# Patient Record
Sex: Female | Born: 1985 | Race: Black or African American | Hispanic: No | Marital: Single | Smoking: Never smoker
Health system: Southern US, Community
[De-identification: ages and names within clinical notes are randomized; demographics above are authoritative.]

## PROBLEM LIST (undated history)

## (undated) ENCOUNTER — Inpatient Hospital Stay (HOSPITAL_COMMUNITY): Payer: Self-pay

## (undated) DIAGNOSIS — O139 Gestational [pregnancy-induced] hypertension without significant proteinuria, unspecified trimester: Secondary | ICD-10-CM

## (undated) DIAGNOSIS — R519 Headache, unspecified: Secondary | ICD-10-CM

## (undated) DIAGNOSIS — I10 Essential (primary) hypertension: Secondary | ICD-10-CM

## (undated) HISTORY — PX: LIPOSUCTION: SHX10

---

## 2007-11-13 ENCOUNTER — Inpatient Hospital Stay (HOSPITAL_COMMUNITY): Admission: AD | Admit: 2007-11-13 | Discharge: 2007-11-14 | Payer: Self-pay | Admitting: Family Medicine

## 2007-11-15 ENCOUNTER — Observation Stay (HOSPITAL_COMMUNITY): Admission: AD | Admit: 2007-11-15 | Discharge: 2007-11-16 | Payer: Self-pay | Admitting: Gynecology

## 2007-11-15 ENCOUNTER — Ambulatory Visit: Payer: Self-pay | Admitting: Obstetrics & Gynecology

## 2007-11-16 ENCOUNTER — Encounter: Payer: Self-pay | Admitting: Obstetrics & Gynecology

## 2008-02-29 ENCOUNTER — Inpatient Hospital Stay (HOSPITAL_COMMUNITY): Admission: AD | Admit: 2008-02-29 | Discharge: 2008-02-29 | Payer: Self-pay | Admitting: Obstetrics & Gynecology

## 2008-04-25 ENCOUNTER — Inpatient Hospital Stay (HOSPITAL_COMMUNITY): Admission: AD | Admit: 2008-04-25 | Discharge: 2008-04-26 | Payer: Self-pay | Admitting: Gynecology

## 2011-01-30 NOTE — Op Note (Signed)
Isabel Cole, HITZEMAN            ACCOUNT NO.:  [id number]   MEDICAL RECORD NO.:  [id number]           PATIENT TYPE:   LOCATION:                                 FACILITY:   PHYSICIAN:  Allie Bossier, MD        DATE OF BIRTH:  05-26-1986   DATE OF PROCEDURE:  DATE OF DISCHARGE:                               OPERATIVE REPORT   PREOPERATIVE DIAGNOSIS:  Incomplete abortion.   POSTOPERATIVE DIAGNOSIS:  Incomplete abortion.   PROCEDURE:  Suction dilatation and curettage.   SURGEON:  Myra C. Marice Potter, MD   ANESTHESIA:  MAC/local (1% lidocaine), Angelica Pou, MD   COMPLICATIONS:  None.   ESTIMATED BLOOD LOSS:  Minimal.   SPECIMEN:  Uterine contents.   DETAILED PROCEDURE AND FINDINGS:  The risks, benefits and alternatives  of surgery were explained, understood and accepted.  Consents were  signed.  She was taken to the operating room and placed in a dorsal  lithotomy position.  MAC anesthesia was applied without complication and  her vagina was prepped and draped the usual sterile fashion.  A bimanual  exam revealed a 6 weeks' size anteverted, mobile uterus and nonenlarged  adnexa.  Her vagina was prepped and draped in the usual sterile fashion,  if I did not mention that.  A speculum was placed.  The anterior lip of  the cervix was grasped with a single-tooth tenaculum.  The cervix was  already dilated approximately 1 cm.  A small amount of membranes were  coming from the os.  The cervix was already dilated to accommodate a #8  curved suction curette.  Suction was applied.  The uterus was emptied of  a moderate amount of contents.  Sharp curettage confirmed complete  emptying of the uterus in all quadrants and the fundus.  A gritty  sensation was appreciated throughout at the end of the procedure.  The  tenaculum was removed.  No bleeding was noted from the site.  Please  note that prior to the uterine manipulation, I did a paracervical block  with 1% lidocaine.  She tolerated the  procedure well.  She was extubated  and taken to the recovery room in stable condition.  She did receive 100  mg of IV doxycycline prior to surgery and will receive Toradol at the  end of the case.      Allie Bossier, MD  Electronically Signed     MCD/MEDQ  D:  11/16/2007  T:  11/17/2007  Job:  [id number]

## 2011-06-08 LAB — CBC
HCT: 37.7
Hemoglobin: 12.8
MCHC: 34.6
MCV: 91.3
Platelets: 300
RBC: 4.16
WBC: 8.7
WBC: 8.9

## 2011-06-08 LAB — HCG, QUANTITATIVE, PREGNANCY: hCG, Beta Chain, Quant, S: 944 — ABNORMAL HIGH

## 2011-06-08 LAB — ABO/RH: ABO/RH(D): B POS

## 2011-06-08 LAB — WET PREP, GENITAL: Yeast Wet Prep HPF POC: NONE SEEN

## 2011-06-14 LAB — CBC
HCT: 36.5
Hemoglobin: 12.7
MCHC: 34.8
MCV: 91.3
RBC: 4

## 2011-06-14 LAB — WET PREP, GENITAL
Trich, Wet Prep: NONE SEEN
Yeast Wet Prep HPF POC: NONE SEEN

## 2011-06-15 LAB — URINALYSIS, ROUTINE W REFLEX MICROSCOPIC
Glucose, UA: NEGATIVE
Specific Gravity, Urine: 1.01
Urobilinogen, UA: 0.2

## 2011-06-15 LAB — URINE MICROSCOPIC-ADD ON

## 2011-06-15 LAB — WET PREP, GENITAL: Yeast Wet Prep HPF POC: NONE SEEN

## 2012-10-17 DIAGNOSIS — G8929 Other chronic pain: Secondary | ICD-10-CM | POA: Insufficient documentation

## 2014-05-17 ENCOUNTER — Emergency Department (HOSPITAL_COMMUNITY)
Admission: EM | Admit: 2014-05-17 | Discharge: 2014-05-18 | Disposition: A | Discharge status: Home / Self Care | Payer: Managed Care, Other (non HMO) | Attending: Emergency Medicine | Admitting: Emergency Medicine

## 2014-05-17 ENCOUNTER — Encounter (HOSPITAL_COMMUNITY): Payer: Self-pay | Admitting: Emergency Medicine

## 2014-05-17 ENCOUNTER — Emergency Department (HOSPITAL_COMMUNITY): Payer: Managed Care, Other (non HMO)

## 2014-05-17 DIAGNOSIS — R112 Nausea with vomiting, unspecified: Secondary | ICD-10-CM | POA: Diagnosis not present

## 2014-05-17 DIAGNOSIS — R197 Diarrhea, unspecified: Secondary | ICD-10-CM | POA: Diagnosis not present

## 2014-05-17 DIAGNOSIS — R109 Unspecified abdominal pain: Secondary | ICD-10-CM | POA: Diagnosis not present

## 2014-05-17 DIAGNOSIS — Z3202 Encounter for pregnancy test, result negative: Secondary | ICD-10-CM | POA: Insufficient documentation

## 2014-05-17 DIAGNOSIS — R509 Fever, unspecified: Secondary | ICD-10-CM | POA: Diagnosis not present

## 2014-05-17 LAB — COMPREHENSIVE METABOLIC PANEL
ALBUMIN: 3.9 g/dL (ref 3.5–5.2)
ALK PHOS: 63 U/L (ref 39–117)
ALT: 21 U/L (ref 0–35)
ANION GAP: 13 (ref 5–15)
AST: 26 U/L (ref 0–37)
BILIRUBIN TOTAL: 0.2 mg/dL — AB (ref 0.3–1.2)
BUN: 9 mg/dL (ref 6–23)
CHLORIDE: 103 meq/L (ref 96–112)
CO2: 24 meq/L (ref 19–32)
Calcium: 9.1 mg/dL (ref 8.4–10.5)
Creatinine, Ser: 0.81 mg/dL (ref 0.50–1.10)
GFR calc Af Amer: >90 mL/min (ref >90)
Glucose, Bld: 94 mg/dL (ref 70–99)
POTASSIUM: 4.1 meq/L (ref 3.7–5.3)
Sodium: 140 mEq/L (ref 137–147)
Total Protein: 7.7 g/dL (ref 6.0–8.3)

## 2014-05-17 LAB — CBC WITH DIFFERENTIAL/PLATELET
Basophils Absolute: 0 10*3/uL (ref 0.0–0.1)
Basophils Relative: 0 % (ref 0–1)
Eosinophils Absolute: 0 10*3/uL (ref 0.0–0.7)
Eosinophils Relative: 0 % (ref 0–5)
HEMATOCRIT: 38.4 % (ref 36.0–46.0)
Hemoglobin: 13.3 g/dL (ref 12.0–15.0)
LYMPHS PCT: 17 % (ref 12–46)
Lymphs Abs: 0.9 10*3/uL (ref 0.7–4.0)
MCH: 30.4 pg (ref 26.0–34.0)
MCHC: 34.6 g/dL (ref 30.0–36.0)
MCV: 87.7 fL (ref 78.0–100.0)
Monocytes Absolute: 0.1 10*3/uL (ref 0.1–1.0)
Monocytes Relative: 3 % (ref 3–12)
NEUTROS PCT: 80 % — AB (ref 43–77)
Neutro Abs: 4.2 10*3/uL (ref 1.7–7.7)
Platelets: 231 10*3/uL (ref 150–400)
RBC: 4.38 MIL/uL (ref 3.87–5.11)
RDW: 11.8 % (ref 11.5–15.5)
WBC: 5.3 10*3/uL (ref 4.0–10.5)

## 2014-05-17 LAB — URINE MICROSCOPIC-ADD ON

## 2014-05-17 LAB — URINALYSIS, ROUTINE W REFLEX MICROSCOPIC
Bilirubin Urine: NEGATIVE
GLUCOSE, UA: NEGATIVE mg/dL
Ketones, ur: NEGATIVE mg/dL
Leukocytes, UA: NEGATIVE
Nitrite: NEGATIVE
PROTEIN: NEGATIVE mg/dL
Specific Gravity, Urine: 1.006 (ref 1.005–1.030)
Urobilinogen, UA: 0.2 mg/dL (ref 0.0–1.0)
pH: 6 (ref 5.0–8.0)

## 2014-05-17 LAB — POC URINE PREG, ED: Preg Test, Ur: NEGATIVE

## 2014-05-17 LAB — LIPASE, BLOOD: Lipase: 44 U/L (ref 11–59)

## 2014-05-17 MED ORDER — SODIUM CHLORIDE 0.9 % IV SOLN
1000.0000 mL | INTRAVENOUS | Status: DC
  Administered 2014-05-17: 1000 mL via INTRAVENOUS

## 2014-05-17 MED ORDER — ACETAMINOPHEN 325 MG PO TABS
650.0000 mg | ORAL_TABLET | Freq: Once | ORAL | Status: AC
  Administered 2014-05-17: 650 mg via ORAL
  Filled 2014-05-17: qty 2

## 2014-05-17 MED ORDER — SODIUM CHLORIDE 0.9 % IV SOLN
1000.0000 mL | Freq: Once | INTRAVENOUS | Status: AC
  Administered 2014-05-17: 1000 mL via INTRAVENOUS

## 2014-05-17 MED ORDER — LOPERAMIDE HCL 2 MG PO CAPS
4.0000 mg | ORAL_CAPSULE | Freq: Once | ORAL | Status: AC
  Administered 2014-05-17: 4 mg via ORAL
  Filled 2014-05-17: qty 2

## 2014-05-17 MED ORDER — MORPHINE SULFATE 4 MG/ML IJ SOLN
4.0000 mg | Freq: Once | INTRAMUSCULAR | Status: AC
  Administered 2014-05-17: 4 mg via INTRAVENOUS
  Filled 2014-05-17: qty 1

## 2014-05-17 MED ORDER — ONDANSETRON HCL 4 MG/2ML IJ SOLN
4.0000 mg | Freq: Once | INTRAMUSCULAR | Status: AC
  Administered 2014-05-17: 4 mg via INTRAVENOUS
  Filled 2014-05-17: qty 2

## 2014-05-17 MED ORDER — IOHEXOL 300 MG/ML  SOLN
50.0000 mL | Freq: Once | INTRAMUSCULAR | Status: AC | PRN
  Administered 2014-05-17: 50 mL via ORAL

## 2014-05-17 NOTE — ED Notes (Signed)
Patient states she has been having abdominal bloating, feeling gassy, and 20 or more diarrheal stools in the past 24 hours. Patient denies any blood in her stool. Patient also has vomited x 4 in the past 24 hours. Patient c/o mid abdominal stabbing afater she drinks or eats anything.

## 2014-05-17 NOTE — ED Provider Notes (Signed)
CSN: [id number]     Arrival date & time 05/17/14  1821 History   First MD Initiated Contact with Patient 05/17/14 2114     Chief Complaint  Patient presents with  . Abdominal Pain  . Diarrhea     (Consider location/radiation/quality/duration/timing/severity/associated sxs/prior Treatment) HPI Patient states yesterday morning she awakened and started having diarrhea. She also started having some right-sided abdominal pain. She states when she eats she feels full and she feels like she's got abdominal bloating. She has vomited twice today and has had about 10 episodes of watery diarrhea without blood. She denies known fever but did have chills today. She states she feels lightheaded without dizziness. She states her eyes are hurting. She states her pain is worse when she changes positions but walking does not make it hurt more. Eating consistently makes it hurt more. Nothing makes it feel better. She has noted some food intolerances for the past year mainly eating greasy foods will make her have nausea. She denies being around anybody else is ill. She denies eating anything that would make her ill. She has never had these symptoms before.  Patient states her mother has had a cholecystectomy, her grandmother on the father's side had diverticulitis.  PCP Dr Iron at [facility]  History reviewed. No pertinent past medical history. Past Surgical History  Procedure Laterality Date  . Cesarean section     Family History  Problem Relation Age of Onset  . Cancer Mother    History  Substance Use Topics  . Smoking status: Never Smoker   . Smokeless tobacco: Never Used  . Alcohol Use: No   Employed as an LPN  OB History   Grav Para Term Preterm Abortions TAB SAB Ect Mult Living                 Review of Systems  All other systems reviewed and are negative.     Allergies  Review of patient's allergies indicates no known allergies.  Home Medications   Prior to  Admission medications   Medication Sig Start Date End Date Taking? Authorizing Provider  ibuprofen (ADVIL,MOTRIN) 800 MG tablet Take 800 mg by mouth once as needed for moderate pain.   Yes Historical Provider, MD   BP 139/80  Pulse 92  Temp(Src) 102.8 F (39.3 C) (Oral)  Resp 16  Ht  (1.549 m)  Wt 145 lb (65.772 kg)  BMI 27.41 kg/m2  SpO2 100%  LMP 04/20/2014  Vital signs normal except for fever  Physical Exam  Nursing note and vitals reviewed. Constitutional: She is oriented to person, place, and time. She appears well-developed and well-nourished.  Non-toxic appearance. She does not appear ill. No distress.  HENT:  Head: Normocephalic and atraumatic.  Right Ear: External ear normal.  Left Ear: External ear normal.  Nose: Nose normal. No mucosal edema or rhinorrhea.  Mouth/Throat: Mucous membranes are dry. No dental abscesses or uvula swelling.  Eyes: Conjunctivae and EOM are normal. Pupils are equal, round, and reactive to light.  Neck: Normal range of motion and full passive range of motion without pain. Neck supple.  Cardiovascular: Normal rate, regular rhythm and normal heart sounds.  Exam reveals no gallop and no friction rub.   No murmur heard. Pulmonary/Chest: Effort normal and breath sounds normal. No respiratory distress. She has no wheezes. She has no rhonchi. She has no rales. She exhibits no tenderness and no crepitus.  Abdominal: Soft. Normal appearance and bowel sounds are normal. She  exhibits no distension. There is tenderness. There is no rebound and no guarding.    Tender in the right mid and upper abdomen, appears uncomfortable when change her stretcher positions  Musculoskeletal: Normal range of motion. She exhibits no edema and no tenderness.  Moves all extremities well.   Neurological: She is alert and oriented to person, place, and time. She has normal strength. No cranial nerve deficit.  Skin: Skin is warm, dry and intact. No rash noted. No erythema.  No pallor.  Psychiatric: She has a normal mood and affect. Her speech is normal and behavior is normal. Her mood appears not anxious.    ED Course  Procedures (including critical care time)  Medications  0.9 %  sodium chloride infusion (0 mLs Intravenous Stopped 05/17/14 2358)    Followed by  0.9 %  sodium chloride infusion (0 mLs Intravenous Stopped 05/17/14 2359)    Followed by  0.9 %  sodium chloride infusion (1,000 mLs Intravenous New Bag/Given 05/17/14 2359)  acetaminophen (TYLENOL) tablet 650 mg (650 mg Oral Given 05/17/14 2148)  morphine 4 MG/ML injection 4 mg (4 mg Intravenous Given 05/17/14 2219)  ondansetron (ZOFRAN) injection 4 mg (4 mg Intravenous Given 05/17/14 2219)  loperamide (IMODIUM) capsule 4 mg (4 mg Oral Given 05/17/14 2219)  iohexol (OMNIPAQUE) 300 MG/ML solution 50 mL (50 mLs Oral Contrast Given 05/17/14 2300)  iohexol (OMNIPAQUE) 300 MG/ML solution 100 mL (100 mLs Intravenous Contrast Given 05/18/14 0103)    Pt given IV fluids for her dehydation. [place] does not show cholecystitis. 01:17 Pt in CT scan of her AP. Dr Criss Alvine will check her CT results.    Labs Review Results for orders placed during the hospital encounter of 05/17/14  CBC WITH DIFFERENTIAL      Result Value Ref Range   WBC 5.3  4.0 - 10.5 K/uL   RBC 4.38  3.87 - 5.11 MIL/uL   Hemoglobin 13.3  12.0 - 15.0 g/dL   HCT 16.1  09.6 - 04.5 %   MCV 87.7  78.0 - 100.0 fL   MCH 30.4  26.0 - 34.0 pg   MCHC 34.6  30.0 - 36.0 g/dL   RDW 40.9  81.1 - 91.4 %   Platelets 231  150 - 400 K/uL   Neutrophils Relative % 80 (*) 43 - 77 %   Neutro Abs 4.2  1.7 - 7.7 K/uL   Lymphocytes Relative 17  12 - 46 %   Lymphs Abs 0.9  0.7 - 4.0 K/uL   Monocytes Relative 3  3 - 12 %   Monocytes Absolute 0.1  0.1 - 1.0 K/uL   Eosinophils Relative 0  0 - 5 %   Eosinophils Absolute 0.0  0.0 - 0.7 K/uL   Basophils Relative 0  0 - 1 %   Basophils Absolute 0.0  0.0 - 0.1 K/uL  COMPREHENSIVE METABOLIC PANEL      Result Value Ref Range    Sodium 140  137 - 147 mEq/L   Potassium 4.1  3.7 - 5.3 mEq/L   Chloride 103  96 - 112 mEq/L   CO2 24  19 - 32 mEq/L   Glucose, Bld 94  70 - 99 mg/dL   BUN 9  6 - 23 mg/dL   Creatinine, Ser 7.82  0.50 - 1.10 mg/dL   Calcium 9.1  8.4 - 95.6 mg/dL   Total Protein 7.7  6.0 - 8.3 g/dL   Albumin 3.9  3.5 - 5.2 g/dL   AST  26  0 - 37 U/L   ALT 21  0 - 35 U/L   Alkaline Phosphatase 63  39 - 117 U/L   Total Bilirubin 0.2 (*) 0.3 - 1.2 mg/dL   GFR calc non Af Amer >90  >90 mL/min   GFR calc Af Amer >90  >90 mL/min   Anion gap 13  5 - 15  LIPASE, BLOOD      Result Value Ref Range   Lipase 44  11 - 59 U/L  URINALYSIS, ROUTINE W REFLEX MICROSCOPIC      Result Value Ref Range   Color, Urine YELLOW  YELLOW   APPearance CLEAR  CLEAR   Specific Gravity, Urine 1.006  1.005 - 1.030   pH 6.0  5.0 - 8.0   Glucose, UA NEGATIVE  NEGATIVE mg/dL   Hgb urine dipstick MODERATE (*) NEGATIVE   Bilirubin Urine NEGATIVE  NEGATIVE   Ketones, ur NEGATIVE  NEGATIVE mg/dL   Protein, ur NEGATIVE  NEGATIVE mg/dL   Urobilinogen, UA 0.2  0.0 - 1.0 mg/dL   Nitrite NEGATIVE  NEGATIVE   Leukocytes, UA NEGATIVE  NEGATIVE  URINE MICROSCOPIC-ADD ON      Result Value Ref Range   Squamous Epithelial / LPF RARE  RARE   WBC, UA 3-6  <3 WBC/hpf   RBC / HPF 7-10  <3 RBC/hpf   Bacteria, UA RARE  RARE  POC URINE PREG, ED      Result Value Ref Range   Preg Test, Ur NEGATIVE  NEGATIVE    Laboratory interpretation all normal except hematuria    Imaging Review US Abdomen Limited Ruq  05/17/2014   CLINICAL DATA:  Right upper quadrant pain, fever, nausea, vomiting.  EXAM: US ABDOMEN LIMITED - RIGHT UPPER QUADRANT  COMPARISON:  None.  FINDINGS: Gallbladder:  Contracted, with mild wall thickening at 4 mm. No shadowing gallstones. Negative sonographic Murphy sign.  Common bile duct:  Diameter: Within normal limits at 4 mm  Liver:  No focal lesion identified. Within normal limits in parenchymal echogenicity.  IMPRESSION:  Contracted gallbladder results in mild wall thickening. No gallstones or sonographic evidence for cholecystitis.   Electronically Signed   By: Jearld Lesch M.D.   On: 05/17/2014 23:31     EKG Interpretation None      MDM   Final diagnoses:  Nausea vomiting and diarrhea  Abdominal pain, right lateral     Disposition pending   Devoria Albe, MD, Armando Gang    Ward Givens, MD 05/18/14 [phone]

## 2014-05-18 ENCOUNTER — Encounter (HOSPITAL_COMMUNITY): Payer: Self-pay | Admitting: Radiology

## 2014-05-18 MED ORDER — IOHEXOL 300 MG/ML  SOLN
100.0000 mL | Freq: Once | INTRAMUSCULAR | Status: AC | PRN
Start: 2014-05-18 — End: 2014-05-18
  Administered 2014-05-18: 100 mL via INTRAVENOUS

## 2014-05-18 NOTE — Discharge Instructions (Signed)
Abdominal Pain Many things can cause abdominal pain. Usually, abdominal pain is not caused by a disease and will improve without treatment. It can often be observed and treated at home. Your health care provider will do a physical exam and possibly order blood tests and X-rays to help determine the seriousness of your pain. However, in many cases, more time must pass before a clear cause of the pain can be found. Before that point, your health care provider may not know if you need more testing or further treatment. HOME CARE INSTRUCTIONS  Monitor your abdominal pain for any changes. The following actions may help to alleviate any discomfort you are experiencing:  Only take over-the-counter or prescription medicines as directed by your health care provider.  Do not take laxatives unless directed to do so by your health care provider.  Try a clear liquid diet (broth, tea, or water) as directed by your health care provider. Slowly move to a bland diet as tolerated. SEEK MEDICAL CARE IF:  You have unexplained abdominal pain.  You have abdominal pain associated with nausea or diarrhea.  You have pain when you urinate or have a bowel movement.  You experience abdominal pain that wakes you in the night.  You have abdominal pain that is worsened or improved by eating food.  You have abdominal pain that is worsened with eating fatty foods.  You have a fever. SEEK IMMEDIATE MEDICAL CARE IF:   Your pain does not go away within 2 hours.  You keep throwing up (vomiting).  Your pain is felt only in portions of the abdomen, such as the right side or the left lower portion of the abdomen.  You pass bloody or black tarry stools. MAKE SURE YOU:  Understand these instructions.   Will watch your condition.   Will get help right away if you are not doing well or get worse.  Document Released: 06/13/2005 Document Revised: 09/08/2013 Document Reviewed: 05/13/2013 [facility] Patient Information  2015 [place], [place]. This information is not intended to replace advice given to you by your health care provider. Make sure you discuss any questions you have with your health care provider.   Viral Gastroenteritis Viral gastroenteritis is also known as stomach flu. This condition affects the stomach and intestinal tract. It can cause sudden diarrhea and vomiting. The illness typically lasts 3 to 8 days. Most people develop an immune response that eventually gets rid of the virus. While this natural response develops, the virus can make you quite ill. CAUSES  Many different viruses can cause gastroenteritis, such as rotavirus or noroviruses. You can catch one of these viruses by consuming contaminated food or water. You may also catch a virus by sharing utensils or other personal items with an infected person or by touching a contaminated surface. SYMPTOMS  The most common symptoms are diarrhea and vomiting. These problems can cause a severe loss of body fluids (dehydration) and a body salt (electrolyte) imbalance. Other symptoms may include:  Fever.  Headache.  Fatigue.  Abdominal pain. DIAGNOSIS  Your caregiver can usually diagnose viral gastroenteritis based on your symptoms and a physical exam. A stool sample may also be taken to test for the presence of viruses or other infections. TREATMENT  This illness typically goes away on its own. Treatments are aimed at rehydration. The most serious cases of viral gastroenteritis involve vomiting so severely that you are not able to keep fluids down. In these cases, fluids must be given through an intravenous line (IV).  HOME CARE INSTRUCTIONS   Drink enough fluids to keep your urine clear or pale yellow. Drink small amounts of fluids frequently and increase the amounts as tolerated.  Ask your caregiver for specific rehydration instructions.  Avoid:  Foods high in sugar.  Alcohol.  Carbonated drinks.  Tobacco.  Juice.  Caffeine  drinks.  Extremely hot or cold fluids.  Fatty, greasy foods.  Too much intake of anything at one time.  Dairy products until 24 to 48 hours after diarrhea stops.  You may consume probiotics. Probiotics are active cultures of beneficial bacteria. They may lessen the amount and number of diarrheal stools in adults. Probiotics can be found in yogurt with active cultures and in supplements.  Wash your hands well to avoid spreading the virus.  Only take over-the-counter or prescription medicines for pain, discomfort, or fever as directed by your caregiver. Do not give aspirin to children. Antidiarrheal medicines are not recommended.  Ask your caregiver if you should continue to take your regular prescribed and over-the-counter medicines.  Keep all follow-up appointments as directed by your caregiver. SEEK IMMEDIATE MEDICAL CARE IF:   You are unable to keep fluids down.  You do not urinate at least once every 6 to 8 hours.  You develop shortness of breath.  You notice blood in your stool or vomit. This may look like coffee grounds.  You have abdominal pain that increases or is concentrated in one small area (localized).  You have persistent vomiting or diarrhea.  You have a fever.  The patient is a child younger than 3 months, and he or she has a fever.  The patient is a child older than 3 months, and he or she has a fever and persistent symptoms.  The patient is a child older than 3 months, and he or she has a fever and symptoms suddenly get worse.  The patient is a baby, and he or she has no tears when crying. MAKE SURE YOU:   Understand these instructions.  Will watch your condition.  Will get help right away if you are not doing well or get worse. Document Released: 09/03/2005 Document Revised: 11/26/2011 Document Reviewed: 06/20/2011 [facility] Patient Information 2015 [place], [place]. This information is not intended to replace advice given to you by your health care  provider. Make sure you discuss any questions you have with your health care provider.   Food Choices to Help Relieve Diarrhea When you have diarrhea, the foods you eat and your eating habits are very important. Choosing the right foods and drinks can help relieve diarrhea. Also, because diarrhea can last up to 7 days, you need to replace lost fluids and electrolytes (such as sodium, potassium, and chloride) in order to help prevent dehydration.  WHAT GENERAL GUIDELINES DO I NEED TO FOLLOW?  Slowly drink 1 cup (8 oz) of fluid for each episode of diarrhea. If you are getting enough fluid, your urine will be clear or pale yellow.  Eat starchy foods. Some good choices include white rice, white toast, pasta, low-fiber cereal, baked potatoes (without the skin), saltine crackers, and bagels.  Avoid large servings of any cooked vegetables.  Limit fruit to two servings per day. A serving is  cup or 1 small piece.  Choose foods with less than 2 g of fiber per serving.  Limit fats to less than 8 tsp (38 g) per day.  Avoid fried foods.  Eat foods that have probiotics in them. Probiotics can be found in certain dairy products.  Avoid foods and beverages that may increase the speed at which food moves through the stomach and intestines (gastrointestinal tract). Things to avoid include:  High-fiber foods, such as dried fruit, raw fruits and vegetables, nuts, seeds, and whole grain foods.  Spicy foods and high-fat foods.  Foods and beverages sweetened with high-fructose corn syrup, honey, or sugar alcohols such as xylitol, sorbitol, and mannitol. WHAT FOODS ARE RECOMMENDED? Grains White rice. White, [place], or pita breads (fresh or toasted), including plain rolls, buns, or bagels. White pasta. Saltine, soda, or graham crackers. Pretzels. Low-fiber cereal. Cooked cereals made with water (such as cornmeal, farina, or cream cereals). Plain muffins. Matzo. Melba toast. Zwieback.  Vegetables Potatoes  (without the skin). Strained tomato and vegetable juices. Most well-cooked and canned vegetables without seeds. Tender lettuce. Fruits Cooked or canned applesauce, apricots, cherries, fruit cocktail, grapefruit, peaches, pears, or plums. Fresh bananas, apples without skin, cherries, grapes, cantaloupe, grapefruit, peaches, oranges, or plums.  Meat and Other Protein Products Baked or boiled chicken. Eggs. Tofu. Fish. Seafood. Smooth peanut butter. Ground or well-cooked tender beef, ham, veal, lamb, pork, or poultry.  Dairy Plain yogurt, kefir, and unsweetened liquid yogurt. Lactose-free milk, buttermilk, or soy milk. Plain hard cheese. Beverages Sport drinks. Clear broths. Diluted fruit juices (except prune). Regular, caffeine-free sodas such as ginger ale. Water. Decaffeinated teas. Oral rehydration solutions. Sugar-free beverages not sweetened with sugar alcohols. Other Bouillon, broth, or soups made from recommended foods.  The items listed above may not be a complete list of recommended foods or beverages. Contact your dietitian for more options. WHAT FOODS ARE NOT RECOMMENDED? Grains Whole grain, whole wheat, bran, or rye breads, rolls, pastas, crackers, and cereals. Wild or brown rice. Cereals that contain more than 2 g of fiber per serving. Corn tortillas or taco shells. Cooked or dry oatmeal. Granola. Popcorn. Vegetables Raw vegetables. Cabbage, broccoli, Brussels sprouts, artichokes, baked beans, beet greens, corn, kale, legumes, peas, sweet potatoes, and yams. Potato skins. Cooked spinach and cabbage. Fruits Dried fruit, including raisins and dates. Raw fruits. Stewed or dried prunes. Fresh apples with skin, apricots, mangoes, pears, raspberries, and strawberries.  Meat and Other Protein Products Chunky peanut butter. Nuts and seeds. Beans and lentils. Tomasa Blase.  Dairy High-fat cheeses. Milk, chocolate milk, and beverages made with milk, such as milk shakes. Cream. Ice cream. Sweets and  Desserts Sweet rolls, doughnuts, and sweet breads. Pancakes and waffles. Fats and Oils Butter. Cream sauces. Margarine. Salad oils. Plain salad dressings. Olives. Avocados.  Beverages Caffeinated beverages (such as coffee, tea, soda, or energy drinks). Alcoholic beverages. Fruit juices with pulp. Prune juice. Soft drinks sweetened with high-fructose corn syrup or sugar alcohols. Other Coconut. Hot sauce. Chili powder. Mayonnaise. Gravy. Cream-based or milk-based soups.  The items listed above may not be a complete list of foods and beverages to avoid. Contact your dietitian for more information. WHAT SHOULD I DO IF I BECOME DEHYDRATED? Diarrhea can sometimes lead to dehydration. Signs of dehydration include dark urine and dry mouth and skin. If you think you are dehydrated, you should rehydrate with an oral rehydration solution. These solutions can be purchased at pharmacies, retail stores, or online.  Drink -1 cup (120-240 mL) of oral rehydration solution each time you have an episode of diarrhea. If drinking this amount makes your diarrhea worse, try drinking smaller amounts more often. For example, drink 1-3 tsp (5-15 mL) every 5-10 minutes.  A general rule for staying hydrated is to drink 1-2 L of fluid per day. Talk  to your health care provider about the specific amount you should be drinking each day. Drink enough fluids to keep your urine clear or pale yellow. Document Released: 11/24/2003 Document Revised: 09/08/2013 Document Reviewed: 07/27/2013 [facility] Patient Information 2015 [place], [place]. This information is not intended to replace advice given to you by your health care provider. Make sure you discuss any questions you have with your health care provider.

## 2014-05-18 NOTE — ED Notes (Signed)
Patient transported to CT 

## 2014-05-18 NOTE — ED Provider Notes (Signed)
2:07 AM Care transferred to me. Patient's CT scan shows mild enteral enteritis. Patient is well-appearing currently and feels significantly improved after fluids. She denies significant nausea and declines any Zofran. She is currently ready for discharge. Discussed increase fluid intake, diet for diarrhea, and return precautions.  Audree Camel, MD 05/18/14 [phone]

## 2014-07-27 ENCOUNTER — Encounter (HOSPITAL_COMMUNITY): Payer: Self-pay | Admitting: *Deleted

## 2014-07-27 ENCOUNTER — Inpatient Hospital Stay (HOSPITAL_COMMUNITY)
Admission: AD | Admit: 2014-07-27 | Discharge: 2014-07-27 | Disposition: A | Discharge status: Home / Self Care | Payer: Managed Care, Other (non HMO) | Source: Ambulatory Visit | Attending: Obstetrics & Gynecology | Admitting: Obstetrics & Gynecology

## 2014-07-27 ENCOUNTER — Inpatient Hospital Stay (HOSPITAL_COMMUNITY): Payer: Managed Care, Other (non HMO)

## 2014-07-27 DIAGNOSIS — O3481 Maternal care for other abnormalities of pelvic organs, first trimester: Secondary | ICD-10-CM | POA: Diagnosis not present

## 2014-07-27 DIAGNOSIS — O10011 Pre-existing essential hypertension complicating pregnancy, first trimester: Secondary | ICD-10-CM | POA: Insufficient documentation

## 2014-07-27 DIAGNOSIS — R109 Unspecified abdominal pain: Secondary | ICD-10-CM | POA: Diagnosis present

## 2014-07-27 DIAGNOSIS — Z331 Pregnant state, incidental: Secondary | ICD-10-CM

## 2014-07-27 DIAGNOSIS — R1031 Right lower quadrant pain: Secondary | ICD-10-CM | POA: Insufficient documentation

## 2014-07-27 DIAGNOSIS — Z3490 Encounter for supervision of normal pregnancy, unspecified, unspecified trimester: Secondary | ICD-10-CM

## 2014-07-27 DIAGNOSIS — N831 Corpus luteum cyst: Secondary | ICD-10-CM

## 2014-07-27 DIAGNOSIS — N8320 Unspecified ovarian cysts: Secondary | ICD-10-CM | POA: Insufficient documentation

## 2014-07-27 DIAGNOSIS — Z3A01 Less than 8 weeks gestation of pregnancy: Secondary | ICD-10-CM | POA: Diagnosis not present

## 2014-07-27 DIAGNOSIS — O9989 Other specified diseases and conditions complicating pregnancy, childbirth and the puerperium: Secondary | ICD-10-CM | POA: Insufficient documentation

## 2014-07-27 DIAGNOSIS — I10 Essential (primary) hypertension: Secondary | ICD-10-CM

## 2014-07-27 LAB — CBC
HCT: 35.9 % — ABNORMAL LOW (ref 36.0–46.0)
Hemoglobin: 12.4 g/dL (ref 12.0–15.0)
MCH: 30.6 pg (ref 26.0–34.0)
MCHC: 34.5 g/dL (ref 30.0–36.0)
MCV: 88.6 fL (ref 78.0–100.0)
PLATELETS: 245 10*3/uL (ref 150–400)
RBC: 4.05 MIL/uL (ref 3.87–5.11)
RDW: 12.5 % (ref 11.5–15.5)
WBC: 7.1 10*3/uL (ref 4.0–10.5)

## 2014-07-27 LAB — URINALYSIS, ROUTINE W REFLEX MICROSCOPIC
Bilirubin Urine: NEGATIVE
Glucose, UA: NEGATIVE mg/dL
Hgb urine dipstick: NEGATIVE
KETONES UR: NEGATIVE mg/dL
LEUKOCYTES UA: NEGATIVE
NITRITE: NEGATIVE
PH: 7 (ref 5.0–8.0)
PROTEIN: NEGATIVE mg/dL
Specific Gravity, Urine: 1.015 (ref 1.005–1.030)
Urobilinogen, UA: 0.2 mg/dL (ref 0.0–1.0)

## 2014-07-27 LAB — WET PREP, GENITAL
Clue Cells Wet Prep HPF POC: NONE SEEN
Trich, Wet Prep: NONE SEEN
WBC, Wet Prep HPF POC: NONE SEEN
Yeast Wet Prep HPF POC: NONE SEEN

## 2014-07-27 LAB — POCT PREGNANCY, URINE: Preg Test, Ur: POSITIVE — AB

## 2014-07-27 LAB — HIV ANTIBODY (ROUTINE TESTING W REFLEX): HIV 1&2 Ab, 4th Generation: NONREACTIVE

## 2014-07-27 LAB — HCG, QUANTITATIVE, PREGNANCY: hCG, Beta Chain, Quant, S: 19307 m[IU]/mL — ABNORMAL HIGH (ref <5)

## 2014-07-27 MED ORDER — LABETALOL HCL 100 MG PO TABS: 100.0000 mg | ORAL_TABLET | Freq: Two times a day (BID) | ORAL | Status: DC

## 2014-07-27 NOTE — MAU Note (Signed)
Pt states that she has a history of high blood pressure, but she is on Norvasc and knows that it can't be taken during pregnancy so she hasn't been taking meds. Delivered last baby at 28 weeks for pre-eclampsia

## 2014-07-27 NOTE — MAU Note (Signed)
Patient state she has had 2 positive home pregnancy tests. Started having right lower abdominal pain 2 days ago. Denies bleeding or discharge, nausea or vomiting.

## 2014-07-27 NOTE — MAU Provider Note (Signed)
History     CSN: [id number]  Arrival date and time: 07/27/14 4098   First Provider Initiated Contact with Patient 07/27/14 [phone]      Chief Complaint  Patient presents with  . Possible Pregnancy  . Abdominal Pain   HPI   Pt is a 28 y/o G2P1 with EGA of [redacted]w[redacted]d based on LMP who presents today with right sided abdominal pain and possible pregnancy. She reports the pain started on Sunday afternoon and has gotten progressively worse. She has not taken anything for the pain. The pain is worse when moving and better when laying down. She reports the pain is shooting and comes and goes. She has not had pain like this before. She denies any nausea, vomiting, diarrhea, fever, chills, headaches, dysuria, hematuria, vaginal bleeding, or abnormal discharge. She reports some constipation and bleeding with hemorrhoids. Her LMP was 10/2. She reports previous pregnancy complications of preeclampsia and delivery of her son via c-section at 28 weeks. She has had no other abdominal surgeries. She reports one sexual partner in the past year. She has an appt. For [facility] at [facility] on 11/19. She also stopped taking her Norvasc 4 days ago when she had a positive at home pregnancy test. She reports BP's of 130s/80s for the past 4 days. She reports taking procardia while breastfeeding with her last pregnancy.   OB History    Gravida Para Term Preterm AB TAB SAB Ectopic Multiple Living   2 1 0 1 1  1   1       No past medical history on file.  Past Surgical History  Procedure Laterality Date  . Cesarean section      Family History  Problem Relation Age of Onset  . Cancer Mother     History  Substance Use Topics  . Smoking status: Never Smoker   . Smokeless tobacco: Never Used  . Alcohol Use: No    Allergies: No Known Allergies  Prescriptions prior to admission  Medication Sig Dispense Refill Last Dose  . amLODipine (NORVASC) 10 MG tablet Take 10 mg by mouth daily.   Past Week at Unknown time   . Prenatal Vit-Fe Fumarate-FA (PRENATAL MULTIVITAMIN) TABS tablet Take 1 tablet by mouth daily at 12 noon.   07/26/2014 at Unknown time  . ibuprofen (ADVIL,MOTRIN) 800 MG tablet Take 800 mg by mouth once as needed for moderate pain.   05/17/2014 at Unknown time    Review of Systems  Constitutional: Negative for fever and chills.  Respiratory: Negative for shortness of breath.   Cardiovascular: Negative for chest pain.  Gastrointestinal: Positive for abdominal pain and constipation. Negative for nausea, vomiting, diarrhea, blood in stool and melena.  Genitourinary: Negative for dysuria and hematuria.       No vaginal bleeding, cramping, or abnormal discharge.   Neurological: Negative for dizziness and headaches.   Physical Exam   Blood pressure 148/95, pulse 103, temperature 98.3 F (36.8 C), resp. rate 18, height 5\' 3"  (1.6 m), weight 69.582 kg (153 lb 6.4 oz).  Physical Exam  Constitutional: She is oriented to person, place, and time. She appears well-developed and well-nourished.  Cardiovascular: Normal rate, regular rhythm, normal heart sounds and intact distal pulses.   HR: 84 bpm on repeat exam  Respiratory: Effort normal and breath sounds normal. No respiratory distress. She has no wheezes. She has no rales.  GI: Soft. Bowel sounds are normal. She exhibits no distension. There is tenderness. There is no rebound, no guarding, no CVA  tenderness, no tenderness at McBurney's point and negative Murphy's sign.  Mild RLQ tenderness to palpation   Genitourinary: Uterus normal. There is no tenderness or lesion on the right labia. There is no tenderness or lesion on the left labia. Uterus is not tender. Cervix exhibits no motion tenderness, no discharge and no friability. Right adnexum displays no mass, no tenderness and no fullness. Left adnexum displays no mass, no tenderness and no fullness. No erythema, tenderness or bleeding in the vagina. No foreign body around the vagina. No signs of  injury around the vagina. Vaginal discharge found.  Creamy, white vaginal discharge without odor  Musculoskeletal: She exhibits no edema or tenderness.  Neurological: She is alert and oriented to person, place, and time.    MAU Course  Procedures  +UPT  U/A: WNL  CBC: WNL  GC/C: pending  Wet prep: None seen  U/S: single IUP measuring [redacted]w[redacted]d by crown-rump length - right ovary with notable corpus luteum cyst    US Ob Comp Less 14 Wks  07/27/2014   CLINICAL DATA:  Right lower quadrant pain x2 days  EXAM: OBSTETRIC <14 WK [place] AND TRANSVAGINAL OB US  TECHNIQUE: Both transabdominal and transvaginal ultrasound examinations were performed for complete evaluation of the gestation as well as the maternal uterus, adnexal regions, and pelvic cul-de-sac. Transvaginal technique was performed to assess early pregnancy.  COMPARISON:  None.  FINDINGS: Intrauterine gestational sac: Visualized/normal in shape.  Yolk sac:  Present  Embryo:  Present  Cardiac Activity: Not discretely visualized.  CRL:   3.6  mm   6 w 1 d                  [place] EDC: 03/21/2015  Maternal uterus/adnexae: No subchorionic hemorrhage.  Right ovary measures 2.9 x 2.2 x 2.6 cm and is notable for a corpus luteal cyst.  Left ovary measures 3.1 x 1.7 x 1.7 cm and is within normal limits.  Trace pelvic fluid.  IMPRESSION: Single intrauterine gestation, measuring 6 weeks 1 day by crown-rump length.  No cardiac activity is visualized, although possibly technical (due to the small size of the fetal pole).  Serial beta HCG is suggested, supplemented by short-term follow-up ultrasound in 3-5 days to document viability as clinically warranted.   Electronically Signed   By: Charline Bills M.D.   On: 07/27/2014 10:41   US Ob Transvaginal  07/27/2014   CLINICAL DATA:  Right lower quadrant pain x2 days  EXAM: OBSTETRIC <14 WK [place] AND TRANSVAGINAL OB US  TECHNIQUE: Both transabdominal and transvaginal ultrasound examinations were performed for complete  evaluation of the gestation as well as the maternal uterus, adnexal regions, and pelvic cul-de-sac. Transvaginal technique was performed to assess early pregnancy.  COMPARISON:  None.  FINDINGS: Intrauterine gestational sac: Visualized/normal in shape.  Yolk sac:  Present  Embryo:  Present  Cardiac Activity: Not discretely visualized.  CRL:   3.6  mm   6 w 1 d                  [place] EDC: 03/21/2015  Maternal uterus/adnexae: No subchorionic hemorrhage.  Right ovary measures 2.9 x 2.2 x 2.6 cm and is notable for a corpus luteal cyst.  Left ovary measures 3.1 x 1.7 x 1.7 cm and is within normal limits.  Trace pelvic fluid.  IMPRESSION: Single intrauterine gestation, measuring 6 weeks 1 day by crown-rump length.  No cardiac activity is visualized, although possibly technical (due to the small size of the  fetal pole).  Serial beta HCG is suggested, supplemented by short-term follow-up ultrasound in 3-5 days to document viability as clinically warranted.   Electronically Signed   By: Charline BillsSriyesh  Krishnan M.D.   On: 07/27/2014 10:41    Results for orders placed or performed during the hospital encounter of 07/27/14 (from the past 72 hour(s))  Urinalysis, Routine w reflex microscopic     Status: None   Collection Time: 07/27/14  8:55 AM  Result Value Ref Range   Color, Urine YELLOW YELLOW   APPearance CLEAR CLEAR   Specific Gravity, Urine 1.015 1.005 - 1.030   pH 7.0 5.0 - 8.0   Glucose, UA NEGATIVE NEGATIVE mg/dL   Hgb urine dipstick NEGATIVE NEGATIVE   Bilirubin Urine NEGATIVE NEGATIVE   Ketones, ur NEGATIVE NEGATIVE mg/dL   Protein, ur NEGATIVE NEGATIVE mg/dL   Urobilinogen, UA 0.2 0.0 - 1.0 mg/dL   Nitrite NEGATIVE NEGATIVE   Leukocytes, UA NEGATIVE NEGATIVE    Comment: MICROSCOPIC NOT DONE ON URINES WITH NEGATIVE PROTEIN, BLOOD, LEUKOCYTES, NITRITE, OR GLUCOSE <1000 mg/dL.  Pregnancy, urine POC     Status: Abnormal   Collection Time: 07/27/14  9:01 AM  Result Value Ref Range   Preg Test, Ur POSITIVE  (A) NEGATIVE    Comment:        THE SENSITIVITY OF THIS METHODOLOGY IS >24 mIU/mL   hCG, quantitative, pregnancy     Status: Abnormal   Collection Time: 07/27/14  9:22 AM  Result Value Ref Range   hCG, Beta Chain, Quant, S 19307 (H) <5 mIU/mL    Comment:          GEST. AGE      CONC.  (mIU/mL)   <=1 WEEK        5 - 50     2 WEEKS       50 - 500     3 WEEKS       100 - 10,000     4 WEEKS     1,000 - 30,000     5 WEEKS     3,500 - 115,000   6-8 WEEKS     12,000 - 270,000    12 WEEKS     15,000 - 220,000        FEMALE AND NON-PREGNANT FEMALE:     LESS THAN 5 mIU/mL   CBC     Status: Abnormal   Collection Time: 07/27/14  9:25 AM  Result Value Ref Range   WBC 7.1 4.0 - 10.5 K/uL   RBC 4.05 3.87 - 5.11 MIL/uL   Hemoglobin 12.4 12.0 - 15.0 g/dL   HCT 04.535.9 (L) 40.936.0 - 81.146.0 %   MCV 88.6 78.0 - 100.0 fL   MCH 30.6 26.0 - 34.0 pg   MCHC 34.5 30.0 - 36.0 g/dL   RDW 91.412.5 78.211.5 - 95.615.5 %   Platelets 245 150 - 400 K/uL  Wet prep, genital     Status: None   Collection Time: 07/27/14 10:25 AM  Result Value Ref Range   Yeast Wet Prep HPF POC NONE SEEN NONE SEEN   Trich, Wet Prep NONE SEEN NONE SEEN   Clue Cells Wet Prep HPF POC NONE SEEN NONE SEEN   WBC, Wet Prep HPF POC NONE SEEN NONE SEEN    Comment: NO BACTERIA SEEN    Low suspicion for appendicitis due to lack of GI symptoms and no elevated WBC.   Assessment and Plan  A: 28 y/o G3P1 with incidental  finding of pregnancy and RLQ pain secondary to corpus luteum cyst. Chronic HTN with no current medication.    P: D/C home  - Rx Labetalol 100 mg BID  - counseled patient on the physiology of corpus luteum cyst  - Warm compress and APAP for pain  - [facility]   Howard, Tayla 07/27/2014, 9:43 AM   I was present for the exam and agree with above.  [place] Favor, CNM 07/28/2014 10:40 AM

## 2014-07-27 NOTE — Discharge Instructions (Signed)
°  Your pain is most likely caused by a corpus luteum cyst which is common in pregnancy. Use Tylenol and warm compresses to help with the pain. You do not need any follow-up for this condition.    Abdominal Pain During Pregnancy Abdominal pain is common in pregnancy. Most of the time, it does not cause harm. There are many causes of abdominal pain. Some causes are more serious than others. Some of the causes of abdominal pain in pregnancy are easily diagnosed. Occasionally, the diagnosis takes time to understand. Other times, the cause is not determined. Abdominal pain can be a sign that something is very wrong with the pregnancy, or the pain may have nothing to do with the pregnancy at all. For this reason, always tell your health care provider if you have any abdominal discomfort. HOME CARE INSTRUCTIONS  Monitor your abdominal pain for any changes. The following actions may help to alleviate any discomfort you are experiencing:  Do not have sexual intercourse or put anything in your vagina until your symptoms go away completely.  Get plenty of rest until your pain improves.  Drink clear fluids if you feel nauseous. Avoid solid food as long as you are uncomfortable or nauseous.  Only take over-the-counter or prescription medicine as directed by your health care provider.  Keep all follow-up appointments with your health care provider. SEEK IMMEDIATE MEDICAL CARE IF:  You are bleeding, leaking fluid, or passing tissue from the vagina.  You have increasing pain or cramping.  You have persistent vomiting.  You have painful or bloody urination.  You have a fever.  You notice a decrease in your baby's movements.  You have extreme weakness or feel faint.  You have shortness of breath, with or without abdominal pain.  You develop a severe headache with abdominal pain.  You have abnormal vaginal discharge with abdominal pain.  You have persistent diarrhea.  You have abdominal pain  that continues even after rest, or gets worse. MAKE SURE YOU:   Understand these instructions.  Will watch your condition.  Will get help right away if you are not doing well or get worse. Document Released: 09/03/2005 Document Revised: 06/24/2013 Document Reviewed: 04/02/2013 [facility] Patient Information 2015 [employer] [place]. This information is not intended to replace advice given to you by your health care provider. Make sure you discuss any questions you have with your health care provider.

## 2014-07-28 LAB — GC/CHLAMYDIA PROBE AMP
CT Probe RNA: NEGATIVE
GC Probe RNA: NEGATIVE

## 2014-08-27 ENCOUNTER — Inpatient Hospital Stay (HOSPITAL_COMMUNITY)
Admission: AD | Admit: 2014-08-27 | Discharge: 2014-08-27 | Disposition: A | Discharge status: Home / Self Care | Payer: Managed Care, Other (non HMO) | Source: Ambulatory Visit | Attending: Obstetrics and Gynecology | Admitting: Obstetrics and Gynecology

## 2014-08-27 ENCOUNTER — Encounter (HOSPITAL_COMMUNITY): Payer: Self-pay | Admitting: *Deleted

## 2014-08-27 DIAGNOSIS — R42 Dizziness and giddiness: Secondary | ICD-10-CM | POA: Diagnosis not present

## 2014-08-27 DIAGNOSIS — Z3A10 10 weeks gestation of pregnancy: Secondary | ICD-10-CM | POA: Diagnosis not present

## 2014-08-27 DIAGNOSIS — O2651 Maternal hypotension syndrome, first trimester: Secondary | ICD-10-CM | POA: Diagnosis not present

## 2014-08-27 DIAGNOSIS — O10011 Pre-existing essential hypertension complicating pregnancy, first trimester: Secondary | ICD-10-CM | POA: Insufficient documentation

## 2014-08-27 HISTORY — DX: Essential (primary) hypertension: I10

## 2014-08-27 HISTORY — DX: Gestational (pregnancy-induced) hypertension without significant proteinuria, unspecified trimester: O13.9

## 2014-08-27 LAB — URINALYSIS, ROUTINE W REFLEX MICROSCOPIC
Bilirubin Urine: NEGATIVE
Glucose, UA: NEGATIVE mg/dL
Ketones, ur: NEGATIVE mg/dL
Nitrite: NEGATIVE
Protein, ur: NEGATIVE mg/dL
SPECIFIC GRAVITY, URINE: 1.025 (ref 1.005–1.030)
Urobilinogen, UA: 0.2 mg/dL (ref 0.0–1.0)
pH: 6 (ref 5.0–8.0)

## 2014-08-27 LAB — URINE MICROSCOPIC-ADD ON

## 2014-08-27 MED ORDER — HYDROXYZINE PAMOATE 25 MG PO CAPS: 25.0000 mg | ORAL_CAPSULE | Freq: Three times a day (TID) | ORAL | Status: DC | PRN

## 2014-08-27 NOTE — MAU Provider Note (Signed)
History     CSN: [id number]  Arrival date and time: 08/27/14 [id number]   First Provider Initiated Contact with Patient 08/27/14 1735      Chief Complaint  Patient presents with  . Dizziness   HPI  Isabel Cole is a 28 y.o. [id number] at 5169w0d who presents today with dizziness. She states that she was on the toilet having a BM, and then when she stood up she "passed out". She does not remember what happened, but remembers waking up on her side. She states that she does not remember hitting her head. She has not pain anywhere in her body. She has CHTN, and is on labetalol 100mg  BID. [place]. She states that she checks her blood pressure 2x week at work. She states that it is "all over the place". Sometimes it will be low (120s/70s), and sometimes it will be higher (160s/100s). She states that she feels worse when her blood pressure is in the lower range. She states that since starting the labetalol she just "feels so bad". She states that she feels tired, and not like herself.   She denies any abdominal pain or vaginal bleeding.   Past Medical History  Diagnosis Date  . Hypertension   . Gestational hypertension     Past Surgical History  Procedure Laterality Date  . Cesarean section      Family History  Problem Relation Age of Onset  . Cancer Mother     History  Substance Use Topics  . Smoking status: Never Smoker   . Smokeless tobacco: Never Used  . Alcohol Use: No    Allergies: No Known Allergies  Prescriptions prior to admission  Medication Sig Dispense Refill Last Dose  . acetaminophen (TYLENOL) 500 MG tablet Take 500 mg by mouth every 6 (six) hours as needed for headache.   Past Week at Unknown time  . labetalol (NORMODYNE) 100 MG tablet Take 1 tablet (100 mg total) by mouth 2 (two) times daily. 60 tablet 1 08/27/2014 at 0900  . Prenatal Vit-Fe Fumarate-FA (PRENATAL MULTIVITAMIN) TABS tablet Take 1 tablet by mouth daily at  12 noon.   08/27/2014 at Unknown time    ROS Physical Exam   Blood pressure 152/89, pulse 100, temperature 99 F (37.2 C), temperature source Oral, resp. rate 18, height 5\' 1"  (1.549 m), weight 72.179 kg (159 lb 2 oz), last menstrual period 06/18/2014.  Physical Exam  Nursing note and vitals reviewed. Constitutional: She is oriented to person, place, and time. She appears well-developed and well-nourished. No distress.  Cardiovascular: Normal rate.   Respiratory: Effort normal.  GI: Soft. There is no tenderness. There is no rebound.  Musculoskeletal: Normal range of motion. She exhibits no edema or tenderness.  Neurological: She is alert and oriented to person, place, and time.  Skin: Skin is warm and dry.  No bruising or signs of injury anywhere on her body.   Psychiatric: She has a normal mood and affect.    MAU Course  Procedures  Results for orders placed or performed during the hospital encounter of 08/27/14 (from the past 24 hour(s))  Urinalysis, Routine w reflex microscopic     Status: Abnormal   Collection Time: 08/27/14  5:07 PM  Result Value Ref Range   Color, Urine YELLOW YELLOW   APPearance CLEAR CLEAR   Specific Gravity, Urine 1.025 1.005 - 1.030   pH 6.0 5.0 - 8.0   Glucose, UA NEGATIVE NEGATIVE mg/dL   Hgb  urine dipstick TRACE (A) NEGATIVE   Bilirubin Urine NEGATIVE NEGATIVE   Ketones, ur NEGATIVE NEGATIVE mg/dL   Protein, ur NEGATIVE NEGATIVE mg/dL   Urobilinogen, UA 0.2 0.0 - 1.0 mg/dL   Nitrite NEGATIVE NEGATIVE   Leukocytes, UA SMALL (A) NEGATIVE  Urine microscopic-add on     Status: Abnormal   Collection Time: 08/27/14  5:07 PM  Result Value Ref Range   Squamous Epithelial / LPF MANY (A) RARE   WBC, UA 3-6 <3 WBC/hpf   RBC / HPF 0-2 <3 RBC/hpf   Bacteria, UA MANY (A) RARE     Assessment and Plan   1. Postural dizziness    Warning signs reviewed  FU with OBGYN  Return to MAU as needed  Follow-up Information    Please follow up.   Contact  information:   [place]        Haide Klinker Donovan 08/27/2014, 5:37 PM

## 2014-08-27 NOTE — Discharge Instructions (Signed)

## 2014-08-27 NOTE — MAU Note (Signed)
Pt states here for dizziness. Was having bowel mvmt, stood up and passed out. Pt's partner heard pt fall and came straight in. Pt was on her side. Takes labetalol 100mg  BID. Pt states doesn't feel sore anywhere.

## 2014-09-26 ENCOUNTER — Other Ambulatory Visit: Payer: Self-pay | Admitting: Advanced Practice Midwife

## 2014-09-26 DIAGNOSIS — O10911 Unspecified pre-existing hypertension complicating pregnancy, first trimester: Secondary | ICD-10-CM

## 2014-11-03 DIAGNOSIS — D649 Anemia, unspecified: Secondary | ICD-10-CM | POA: Insufficient documentation

## 2014-11-03 DIAGNOSIS — Z8759 Personal history of other complications of pregnancy, childbirth and the puerperium: Secondary | ICD-10-CM | POA: Insufficient documentation

## 2014-11-18 DIAGNOSIS — Z98891 History of uterine scar from previous surgery: Secondary | ICD-10-CM | POA: Insufficient documentation

## 2015-06-01 ENCOUNTER — Encounter (HOSPITAL_COMMUNITY): Payer: Self-pay | Admitting: *Deleted

## 2018-04-01 ENCOUNTER — Emergency Department (HOSPITAL_BASED_OUTPATIENT_CLINIC_OR_DEPARTMENT_OTHER): Payer: PRIVATE HEALTH INSURANCE

## 2018-04-01 ENCOUNTER — Encounter (HOSPITAL_BASED_OUTPATIENT_CLINIC_OR_DEPARTMENT_OTHER): Payer: Self-pay | Admitting: Respiratory Therapy

## 2018-04-01 ENCOUNTER — Emergency Department (HOSPITAL_BASED_OUTPATIENT_CLINIC_OR_DEPARTMENT_OTHER)
Admission: EM | Admit: 2018-04-01 | Discharge: 2018-04-01 | Disposition: A | Discharge status: Home / Self Care | Payer: PRIVATE HEALTH INSURANCE | Attending: Emergency Medicine | Admitting: Emergency Medicine

## 2018-04-01 ENCOUNTER — Other Ambulatory Visit: Payer: Self-pay

## 2018-04-01 DIAGNOSIS — E876 Hypokalemia: Secondary | ICD-10-CM | POA: Insufficient documentation

## 2018-04-01 DIAGNOSIS — I10 Essential (primary) hypertension: Secondary | ICD-10-CM | POA: Diagnosis not present

## 2018-04-01 DIAGNOSIS — Z79899 Other long term (current) drug therapy: Secondary | ICD-10-CM | POA: Diagnosis not present

## 2018-04-01 DIAGNOSIS — R0789 Other chest pain: Secondary | ICD-10-CM | POA: Diagnosis not present

## 2018-04-01 DIAGNOSIS — R079 Chest pain, unspecified: Secondary | ICD-10-CM | POA: Diagnosis present

## 2018-04-01 LAB — BASIC METABOLIC PANEL
ANION GAP: 9 (ref 5–15)
BUN: 10 mg/dL (ref 6–20)
CALCIUM: 9.1 mg/dL (ref 8.9–10.3)
CO2: 24 mmol/L (ref 22–32)
CREATININE: 0.63 mg/dL (ref 0.44–1.00)
Chloride: 106 mmol/L (ref 98–111)
GFR calc Af Amer: >60 mL/min (ref >60)
GLUCOSE: 209 mg/dL — AB (ref 70–99)
Potassium: 3.3 mmol/L — ABNORMAL LOW (ref 3.5–5.1)
Sodium: 139 mmol/L (ref 135–145)

## 2018-04-01 LAB — CBC
HCT: 39.1 % (ref 36.0–46.0)
HEMOGLOBIN: 13.7 g/dL (ref 12.0–15.0)
MCH: 30.6 pg (ref 26.0–34.0)
MCHC: 35 g/dL (ref 30.0–36.0)
MCV: 87.3 fL (ref 78.0–100.0)
PLATELETS: 320 10*3/uL (ref 150–400)
RBC: 4.48 MIL/uL (ref 3.87–5.11)
RDW: 12.3 % (ref 11.5–15.5)
WBC: 8.6 10*3/uL (ref 4.0–10.5)

## 2018-04-01 LAB — PREGNANCY, URINE: Preg Test, Ur: NEGATIVE

## 2018-04-01 LAB — D-DIMER, QUANTITATIVE: D-Dimer, Quant: <0.27 ug/mL-FEU (ref 0.00–0.50)

## 2018-04-01 LAB — TROPONIN I

## 2018-04-01 MED ORDER — POTASSIUM CHLORIDE CRYS ER 20 MEQ PO TBCR
40.0000 meq | EXTENDED_RELEASE_TABLET | Freq: Once | ORAL | Status: AC
  Administered 2018-04-01: 40 meq via ORAL
  Filled 2018-04-01: qty 2

## 2018-04-01 NOTE — Discharge Instructions (Signed)
The lab and imaging results were reassuring. There is evidence of nonspecific EKG abnormality.  Follow-up with cardiology on this matter. There is also evidence of minor low potassium.  Follow-up with your primary care provider on this matter. Should symptoms worsen and you have to return to the ED, [facility].

## 2018-04-01 NOTE — ED Notes (Signed)
ED Provider at bedside. 

## 2018-04-01 NOTE — ED Triage Notes (Signed)
Left shoulder pain. Pressure in her left chest. Symptoms started this am while at work.

## 2018-04-01 NOTE — ED Provider Notes (Signed)
[facility] EMERGENCY DEPARTMENT Provider Note   CSN: [id number] Arrival date & time: 04/01/18  1419     History   Chief Complaint Chief Complaint  Patient presents with  . Chest Pain    HPI Isabel Cole is a 32 y.o. female.  HPI   Isabel Cole is a 32 y.o. female, with a history of HTN, presenting to the ED with chest discomfort beginning upon waking this morning.  States she will have intermittent sharp pain in the left upper back followed immediately after by chest pressure, 10/10, states, "it takes my breath away and makes me stop what ever I am doing."  Symptoms last for a couple minutes at most. States she was at work, went to the on-site clinic, and was told she had an abnormal EKG and needed to come to the ED.   Denies fever/chills, dizziness, diaphoresis, N/V/D, abdominal pain, cough, lower extremity edema/pain, or any other complaints.       Past Medical History:  Diagnosis Date  . Gestational hypertension   . Hypertension     There are no active problems to display for this patient.   Past Surgical History:  Procedure Laterality Date  . CESAREAN SECTION       OB History    Gravida  3   Para  1   Term  0   Preterm  1   AB  1   Living  1     SAB  1   TAB      Ectopic      Multiple      Live Births               Home Medications    Prior to Admission medications   Medication Sig Start Date End Date Taking? Authorizing Provider  amLODipine (NORVASC) 10 MG tablet Take 10 mg by mouth daily.   Yes [provider]  acetaminophen (TYLENOL) 500 MG tablet Take 500 mg by mouth every 6 (six) hours as needed for headache.    [provider]  hydrOXYzine (VISTARIL) 25 MG capsule Take 1 capsule (25 mg total) by mouth 3 (three) times daily as needed (dizziness). 08/27/14   Thressa Sheller D, CNM  labetalol (NORMODYNE) 100 MG tablet TAKE 1 TABELT 2 TIMES A DAY 09/26/14   Katrinka Blazing, [place], CNM  Prenatal Vit-Fe  Fumarate-FA (PRENATAL MULTIVITAMIN) TABS tablet Take 1 tablet by mouth daily at 12 noon.    [provider]    Family History Family History  Problem Relation Age of Onset  . Cancer Mother     Social History Social History   Tobacco Use  . Smoking status: Never Smoker  . Smokeless tobacco: Never Used  Substance Use Topics  . Alcohol use: No  . Drug use: No     Allergies   Patient has no known allergies.   Review of Systems Review of Systems  Constitutional: Negative for chills, diaphoresis and fever.  Respiratory: Negative for cough.   Cardiovascular: Positive for chest pain. Negative for leg swelling.  Gastrointestinal: Negative for abdominal pain, diarrhea, nausea and vomiting.  Musculoskeletal: Positive for back pain.  Neurological: Negative for weakness and numbness.  All other systems reviewed and are negative.    Physical Exam Updated Vital Signs BP (!) 150/95   Pulse 96   Temp 99.2 F (37.3 C) (Oral)   Resp 20   Ht 5\' 1"  (1.549 m)   Wt 74.8 kg (165 lb)   SpO2 100%  BMI 31.18 kg/m   Physical Exam  Constitutional: She appears well-developed and well-nourished. No distress.  HENT:  Head: Normocephalic and atraumatic.  Eyes: Conjunctivae are normal.  Neck: Neck supple.  Cardiovascular: Normal rate, regular rhythm, normal heart sounds and intact distal pulses.  Pulses:      Radial pulses are 2+ on the right side, and 2+ on the left side.       Dorsalis pedis pulses are 2+ on the right side, and 2+ on the left side.       Posterior tibial pulses are 2+ on the right side, and 2+ on the left side.  Pulmonary/Chest: Effort normal and breath sounds normal. No respiratory distress.  No increased work of breathing.  Speaks in full sentences without difficulty.  Abdominal: Soft. There is no tenderness. There is no guarding.  Musculoskeletal: She exhibits no edema or tenderness.  No tenderness in the left upper back or in the chest. No lower  extremity edema, erythema, increased warmth, or tenderness.  Lymphadenopathy:    She has no cervical adenopathy.  Neurological: She is alert.  Sensation grossly intact to light touch in all four extremities. Strength 5/5 in all extremities. No gait disturbance. Coordination intact. Cranial nerves III-XII grossly intact. No facial droop.   Skin: Skin is warm and dry. She is not diaphoretic.  Psychiatric: She has a normal mood and affect. Her behavior is normal.  Nursing note and vitals reviewed.    ED Treatments / Results  Labs (all labs ordered are listed, but only abnormal results are displayed) Labs Reviewed  BASIC METABOLIC PANEL - Abnormal; Notable for the following components:      Result Value   Potassium 3.3 (*)    Glucose, Bld 209 (*)    All other components within normal limits  CBC  TROPONIN I  PREGNANCY, URINE  D-DIMER, QUANTITATIVE (NOT AT [facility])    EKG EKG Interpretation  Date/Time:  Tuesday April 01 2018 14:32:34 EDT Ventricular Rate:  108 PR Interval:    QRS Duration: 100 QT Interval:  322 QTC Calculation: 432 R Axis:   74 Text Interpretation:  Sinus tachycardia Borderline repolarization abnormality No old tracing to compare Confirmed by Melene PlanFloyd, Dan [phone]([id number]) on 04/01/2018 3:03:39 PM   Radiology Dg Chest 2 View  Result Date: 04/01/2018 CLINICAL DATA:  Left-sided chest pain. EXAM: CHEST - 2 VIEW COMPARISON:  None. FINDINGS: The heart size and mediastinal contours are within normal limits. Both lungs are clear. The visualized skeletal structures are unremarkable. IMPRESSION: Normal chest. Electronically Signed   By: Obie DredgeWilliam T Derry M.D.   On: 04/01/2018 15:07    Procedures Procedures (including critical care time)  Medications Ordered in ED Medications  potassium chloride SA (K-DUR,KLOR-CON) CR tablet 40 mEq (40 mEq Oral Given 04/01/18 1600)     Initial Impression / Assessment and Plan / ED Course  I have reviewed the triage vital signs and the nursing  notes.  Pertinent labs & imaging results that were available during my care of the patient were reviewed by me and considered in my medical decision making (see chart for details).  Clinical Course as of Apr 02 1643  Tue Apr 01, 2018  1631 States she is working with her PCP on controlling her heart rate.   Pulse Rate: 97 [SJ]    Clinical Course User Index [SJ] Miaa Latterell C, PA-C    Patient presents with fleeting pain in the back and chest pressure. Patient is nontoxic appearing, afebrile, not tachycardic through  most of ED course, not tachypneic, not hypotensive, maintains excellent SPO2 on room air, and is in no apparent distress.  Low suspicion for ACS. HEART score is 2, indicating low risk for a cardiac event. Wells criteria score is 1.5, indicating low risk for PE. Cannot PERC, EKG with mild tachycardia.  Troponin negative.  D-dimer negative.  Nonspecific repolarization abnormality on EKG.  Shared decision-making was used regarding second troponin.  Patient opted against the second troponin.  She will follow-up with her PCP and cardiology. The patient was given instructions for home care as well as return precautions. Patient voices understanding of these instructions, accepts the plan, and is comfortable with discharge.    Vitals:   04/01/18 1433 04/01/18 1500 04/01/18 1530 04/01/18 1600  BP: (!) 150/95 (!) 141/95 140/88 (!) 140/93  Pulse: 96 96 86 97  Resp: 20 (!) 23 17 15   Temp: 99.2 F (37.3 C)     TempSrc: Oral     SpO2: 100% 98% 99% 100%  Weight:      Height:         Final Clinical Impressions(s) / ED Diagnoses   Final diagnoses:  Atypical chest pain  Hypokalemia    ED Discharge Orders    None       Concepcion Living 04/01/18 1647    Melene Plan, DO 04/02/18 [phone]

## 2018-05-01 ENCOUNTER — Encounter: Payer: Self-pay | Admitting: Cardiology

## 2018-05-01 ENCOUNTER — Ambulatory Visit: Payer: PRIVATE HEALTH INSURANCE | Admitting: Cardiology

## 2018-05-01 VITALS — BP 143/91 | HR 104 | Ht 61.0 in | Wt 172.4 lb

## 2018-05-01 DIAGNOSIS — I10 Essential (primary) hypertension: Secondary | ICD-10-CM

## 2018-05-01 DIAGNOSIS — Z01812 Encounter for preprocedural laboratory examination: Secondary | ICD-10-CM

## 2018-05-01 DIAGNOSIS — R079 Chest pain, unspecified: Secondary | ICD-10-CM | POA: Diagnosis not present

## 2018-05-01 MED ORDER — METOPROLOL TARTRATE 50 MG PO TABS: 50.0000 mg | ORAL_TABLET | Freq: Once | ORAL | 0 refills | Status: DC

## 2018-05-01 NOTE — Patient Instructions (Addendum)
Medication Instructions: Your physician recommends that you continue on your current medications as directed.    If you need a refill on your cardiac medications before your next appointment, please call your pharmacy.   Labwork: Your physician recommends that you return for lab work 1 week prior to test. (BMP)   Procedures/Testing: Your physician has requested that you have cardiac CT. Cardiac computed tomography (CT) is a painless test that uses an x-ray machine to take clear, detailed pictures of your heart. For further information please visit [email or web address]. Please follow instruction sheet as given.     Follow-Up: Your physician wants you to follow-up in 3-4 months with Dr. Cristal Deerhristopher.   Special Instructions:               Please arrive at the [facility] main entrance of [facility] at xx:xx AM (30-45 minutes prior to test start time)  [facility] [address] [place], [place] [address] [phone]  Proceed to the [facility] Radiology Department (First Floor).  Please follow these instructions carefully (unless otherwise directed):  Hold all erectile dysfunction medications at least 48 hours prior to test.  On the Night Before the Test: . Drink plenty of water. . Do not consume any caffeinated/decaffeinated beverages or chocolate 12 hours prior to your test. . Do not take any antihistamines 12 hours prior to your test.   On the Day of the Test: . Drink plenty of water. Do not drink any water within one hour of the test. . Do not eat any food 4 hours prior to the test. . You may take your regular medications prior to the test. . IF NOT ON A BETA BLOCKER - Take 50 mg of lopressor (metoprolol) one hour before the test. . HOLD Furosemide morning of the test.  After the Test: . Drink plenty of water. . After receiving IV contrast, you may experience a mild flushed feeling. This is normal. . On occasion, you may experience  a mild rash up to 24 hours after the test. This is not dangerous. If this occurs, you can take Benadryl 25 mg and increase your fluid intake. . If you experience trouble breathing, this can be serious. If it is severe call 911 IMMEDIATELY. If it is mild, please call our office. . If you take any of these medications: Glipizide/Metformin, Avandament, Glucavance, please do not take 48 hours after completing test.     Thank you for choosing Heartcare at [facility]!!

## 2018-05-01 NOTE — Progress Notes (Signed)
Cardiology Office Note:    Date:  05/01/2018   ID:  Isabel Cole, DOB [birth year], MRN [id number]  PCP:  Carolan ClinesSnider, Gina M, PA  Cardiologist:  Jodelle RedBridgette Olawale Marney, MD PhD  Referring MD: Carolan ClinesSnider, Gina M, [place]   Chief complaint: Chest pain  History of Present Illness:    Isabel Cole is a 32 y.o. female with a hx of hypertension who is seen as a new consult at the request of Bettye BoeckGina Snider for evaluation of chest pain. [facility]. Her troponin and D-dimer were negative.   Per patient: first episode of chest pain occurred the morning of 04/01/18 while she was at work. Felt like stabbing in the back and pressure in the front of her chest. She has had multiple episodes since that time. She can go days without having an episode or can have multiple times per day. No clear aggravating or alleviating factors. Each episode lasts only seconds, but occurs multiple times in a row. No radiation. No SOB or nausea.   Also feels dizzy/lightheaded with yawning or stretching. Gets tunnel-vision. Started noticing about the same time as her chest pain. Has not lost consciousness. Resolves spontaneously.   Notes that her resting heart rate has always been elevated. It can go much higher when she exercises, and she also notes a pounding/hearbeat sensation in her head when she exercises.  Has a history of gestational hypertension. Was on medication including labetalol, but is not currently on meds. Has been monitoring her BP with her PCP.   Denies shortness of breath, PND, orthopnea, LE edema, syncope, or palpitations.  Past Medical History:  Diagnosis Date  . Gestational hypertension   . Hypertension     Past Surgical History:  Procedure Laterality Date  . CESAREAN SECTION      Current Medications: No current outpatient medications on file prior to visit.   No current facility-administered medications on file prior to visit.      Allergies:   Patient has no  known allergies.   Social History   Socioeconomic History  . Marital status: Single    Spouse name: Not on file  . Number of children: Not on file  . Years of education: Not on file  . Highest education level: Not on file  Occupational History  . Not on file  Social Needs  . Financial resource strain: Not on file  . Food insecurity:    Worry: Not on file    Inability: Not on file  . Transportation needs:    Medical: Not on file    Non-medical: Not on file  Tobacco Use  . Smoking status: Never Smoker  . Smokeless tobacco: Never Used  Substance and Sexual Activity  . Alcohol use: No  . Drug use: No  . Sexual activity: Not on file  Lifestyle  . Physical activity:    Days per week: Not on file    Minutes per session: Not on file  . Stress: Not on file  Relationships  . Social connections:    Talks on phone: Not on file    Gets together: Not on file    Attends religious service: Not on file    Active member of club or organization: Not on file    Attends meetings of clubs or organizations: Not on file    Relationship status: Not on file  Other Topics Concern  . Not on file  Social History Narrative  . Not on file  Family History: The patient's family history includes Cancer in her mother.  ROS:   Please see the history of present illness.  Additional pertinent ROS: ROS   EKGs/Labs/Other Studies Reviewed:    The following studies were reviewed today: Prior ER visit and ECG  EKG:  EKG is ordered today.  The ekg ordered today demonstrates sinus tachycardia.  Recent Labs: 04/01/2018: BUN 10; Creatinine, Ser 0.63; Hemoglobin 13.7; Platelets 320; Potassium 3.3; Sodium 139  Recent Lipid Panel No results found for: CHOL, TRIG, HDL, CHOLHDL, VLDL, LDLCALC, LDLDIRECT  Physical Exam:    VS:  BP (!) 143/91   Pulse (!) 104   Ht 5\' 1"  (1.549 m)   Wt 172 lb 6.4 oz (78.2 kg)   SpO2 99%   BMI 32.57 kg/m     Wt Readings from Last 3 Encounters:  05/01/18 172 lb  6.4 oz (78.2 kg)  04/01/18 165 lb (74.8 kg)  08/27/14 159 lb 2 oz (72.2 kg)     GEN: Well nourished, well developed in no acute distress HEENT: Normal NECK: No JVD; No carotid bruits LYMPHATICS: No lymphadenopathy CARDIAC: slightly tachycardic but regular rhythm, normal S1 and S2, no murmurs, rubs, gallops. Radial and DP pulses 2+ bilaterally. RESPIRATORY:  Clear to auscultation without rales, wheezing or rhonchi  ABDOMEN: Soft, non-tender, non-distended MUSCULOSKELETAL:  No edema; No deformity  SKIN: Warm and dry NEUROLOGIC:  Alert and oriented x 3 PSYCHIATRIC:  Normal affect   ASSESSMENT:    1. Chest pain, unspecified type   2. Pre-operative laboratory examination   3. Essential hypertension    PLAN:    1. Chest pain: age, type of pain make this unlikely to be cardiac ischemia. However, she does have a history of gestational hypertension, and this is a risk equivalent for cardiovascular disease. We discussed multiple approaches to evaluating her pain. Given the amount of anxiety it causes her, she wishes to pursue cardiac CTA for further evaluation. We will order a pregnancy test (denies possibility of pregnancy) and BMET prior to her procedure. Given her resting heart rate, will also order metoprolol to be given.   2. Hypertension: she is anxious today, but I suspect she may need treatment for her blood pressure given her history. Also suspect that her blood pressure climbs with exercise. We will address the chest pain first, and then we will evaluate her hypertension at the next visit. No red flag symptoms currently.  The ASCVD Risk score Denman George(Goff DC Jr., et al., 2013) failed to calculate for the following reasons:   The 2013 ASCVD risk score is only valid for ages 2140 to 8279   Plan for follow up: 3-4 months  Medication Adjustments/Labs and Tests Ordered: Current medicines are reviewed at length with the patient today.  Concerns regarding medicines are outlined above.  Orders  Placed This Encounter  Procedures  . CT CORONARY MORPH W/CTA COR W/SCORE W/CA W/CM &/OR WO/CM  . CT CORONARY FRACTIONAL FLOW RESERVE DATA PREP  . CT CORONARY FRACTIONAL FLOW RESERVE FLUID ANALYSIS  . Basic metabolic panel  . Pregnancy, urine  . EKG 12-Lead   Meds ordered this encounter  Medications  . metoprolol tartrate (LOPRESSOR) 50 MG tablet    Sig: Take 1 tablet (50 mg total) by mouth once for 1 dose. Take 1 hour prior to test    Dispense:  1 tablet    Refill:  0    Patient Instructions  Medication Instructions: Your physician recommends that you continue on your current medications  as directed.    If you need a refill on your cardiac medications before your next appointment, please call your pharmacy.   Labwork: Your physician recommends that you return for lab work 1 week prior to test. (BMP)   Procedures/Testing: Your physician has requested that you have cardiac CT. Cardiac computed tomography (CT) is a painless test that uses an x-ray machine to take clear, detailed pictures of your heart. For further information please visit [email or web address] Please follow instruction sheet as given.     Follow-Up: Your physician wants you to follow-up in 3-4 months with Dr. Cristal Deer.   Special Instructions:               Please arrive at the [facility] main entrance of [facility] at xx:xx AM (30-45 minutes prior to test start time)  [facility] [address] [place], [place] [address] [phone]  Proceed to the [facility] Radiology Department (First Floor).  Please follow these instructions carefully (unless otherwise directed):  Hold all erectile dysfunction medications at least 48 hours prior to test.  On the Night Before the Test: . Drink plenty of water. . Do not consume any caffeinated/decaffeinated beverages or chocolate 12 hours prior to your test. . Do not take any antihistamines 12 hours prior to your  test.   On the Day of the Test: . Drink plenty of water. Do not drink any water within one hour of the test. . Do not eat any food 4 hours prior to the test. . You may take your regular medications prior to the test. . IF NOT ON A BETA BLOCKER - Take 50 mg of lopressor (metoprolol) one hour before the test. . HOLD Furosemide morning of the test.  After the Test: . Drink plenty of water. . After receiving IV contrast, you may experience a mild flushed feeling. This is normal. . On occasion, you may experience a mild rash up to 24 hours after the test. This is not dangerous. If this occurs, you can take Benadryl 25 mg and increase your fluid intake. . If you experience trouble breathing, this can be serious. If it is severe call 911 IMMEDIATELY. If it is mild, please call our office. . If you take any of these medications: Glipizide/Metformin, Avandament, Glucavance, please do not take 48 hours after completing test.     Thank you for choosing Heartcare at [facility]!!       Signed, Jodelle Red, MD PhD 05/01/2018 5:01 PM    [facility]

## 2018-06-06 ENCOUNTER — Telehealth: Payer: Self-pay | Admitting: Cardiology

## 2018-06-06 NOTE — Telephone Encounter (Signed)
Received call from patient to cancel the [place] CT schedule for 06-10-18.  Patient didn't want to reschedule the test.

## 2018-06-10 ENCOUNTER — Ambulatory Visit (HOSPITAL_COMMUNITY): Payer: PRIVATE HEALTH INSURANCE

## 2018-08-01 ENCOUNTER — Ambulatory Visit: Payer: PRIVATE HEALTH INSURANCE | Admitting: Cardiology

## 2018-08-07 ENCOUNTER — Encounter: Payer: Self-pay | Admitting: Cardiology

## 2019-05-14 ENCOUNTER — Encounter (HOSPITAL_COMMUNITY): Payer: Self-pay

## 2019-05-14 ENCOUNTER — Other Ambulatory Visit: Payer: Self-pay

## 2019-05-14 ENCOUNTER — Ambulatory Visit (HOSPITAL_COMMUNITY)
Admission: EM | Admit: 2019-05-14 | Discharge: 2019-05-14 | Disposition: A | Discharge status: Home / Self Care | Payer: PRIVATE HEALTH INSURANCE | Attending: Family Medicine | Admitting: Family Medicine

## 2019-05-14 DIAGNOSIS — J029 Acute pharyngitis, unspecified: Secondary | ICD-10-CM | POA: Insufficient documentation

## 2019-05-14 DIAGNOSIS — Z79899 Other long term (current) drug therapy: Secondary | ICD-10-CM | POA: Insufficient documentation

## 2019-05-14 DIAGNOSIS — Z809 Family history of malignant neoplasm, unspecified: Secondary | ICD-10-CM | POA: Insufficient documentation

## 2019-05-14 DIAGNOSIS — I10 Essential (primary) hypertension: Secondary | ICD-10-CM | POA: Insufficient documentation

## 2019-05-14 DIAGNOSIS — Z20828 Contact with and (suspected) exposure to other viral communicable diseases: Secondary | ICD-10-CM | POA: Insufficient documentation

## 2019-05-14 LAB — POCT RAPID STREP A: Streptococcus, Group A Screen (Direct): NEGATIVE

## 2019-05-14 NOTE — Discharge Instructions (Signed)
You have been tested for coronavirus and strep These tests will be available in 2-3 days You may not return to work until tests are confirmed negative You must quarantine at home until the results are negative Take tylenol for pain or fever Drink plenty of fluids

## 2019-05-14 NOTE — ED Triage Notes (Signed)
Pt presents with throat swelling around lymph node area upon waking up today.

## 2019-05-14 NOTE — ED Provider Notes (Signed)
[place]    CSN: [id number] Arrival date & time: 05/14/19  [place]      History   Chief Complaint Chief Complaint  Patient presents with  . Throat Swelling    HPI Isabel Cole is a 33 y.o. female.   HPI  Patient works in a nursing home.  She gets cover testing once a week.  She is very careful about any respiratory infections or symptoms.  She woke up this morning with a sore throat.  It hurts overall on her left side of her neck, left tonsil, and left lymph node.  Painful with swallowing pills "feels like something is stuck in my throat".  No fever chills.  No runny nose.  No ear pressure pain.  No body aches.  No coughing or shortness of breath.  No known exposure to strep.  No known exposure to COVID.  She is very careful with her social distancing.  She is very careful with wearing a mask  Past Medical History:  Diagnosis Date  . Gestational hypertension   . Hypertension     There are no active problems to display for this patient.   Past Surgical History:  Procedure Laterality Date  . CESAREAN SECTION      OB History    Gravida  3   Para  1   Term  0   Preterm  1   AB  1   Living  1     SAB  1   TAB      Ectopic      Multiple      Live Births               Home Medications    Prior to Admission medications   Medication Sig Start Date End Date Taking? Authorizing Provider  metoprolol tartrate (LOPRESSOR) 50 MG tablet Take 1 tablet (50 mg total) by mouth once for 1 dose. Take 1 hour prior to test 05/01/18 05/01/18  Buford Dresser, MD    Family History Family History  Problem Relation Age of Onset  . Cancer Mother     Social History Social History   Tobacco Use  . Smoking status: Never Smoker  . Smokeless tobacco: Never Used  Substance Use Topics  . Alcohol use: No  . Drug use: No     Allergies   Patient has no known allergies.   Review of Systems Review of Systems  Constitutional: Negative for  chills and fever.  HENT: Positive for sore throat. Negative for ear pain.   Eyes: Negative for pain and visual disturbance.  Respiratory: Negative for cough and shortness of breath.   Cardiovascular: Negative for chest pain and palpitations.  Gastrointestinal: Negative for abdominal pain and vomiting.  Genitourinary: Negative for dysuria and hematuria.  Musculoskeletal: Negative for arthralgias and back pain.  Skin: Negative for color change and rash.  Neurological: Negative for seizures and syncope.  All other systems reviewed and are negative.    Physical Exam Triage Vital Signs ED Triage Vitals  Enc Vitals Group     BP 05/14/19 1823 (!) 167/124     Pulse Rate 05/14/19 1823 98     Resp 05/14/19 1823 17     Temp 05/14/19 1823 98.8 F (37.1 C)     Temp Source 05/14/19 1823 Oral     SpO2 05/14/19 1823 98 %     Weight --      Height --      Head Circumference --  Peak Flow --      Pain Score 05/14/19 1824 6     Pain Loc --      Pain Edu? --      Excl. in GC? --    No data found.  Updated Vital Signs BP (!) 167/124 (BP Location: Right Arm)   Pulse 98   Temp 98.8 F (37.1 C) (Oral)   Resp 17   LMP 05/05/2019   SpO2 98%      Physical Exam Constitutional:      General: She is not in acute distress.    Appearance: She is well-developed.  HENT:     Head: Normocephalic and atraumatic.     Right Ear: Tympanic membrane, ear canal and external ear normal.     Left Ear: Tympanic membrane, ear canal and external ear normal.     Nose: Nose normal.     Mouth/Throat:     Mouth: Mucous membranes are moist.     Pharynx: Posterior oropharyngeal erythema present.     Comments: Mild erythema of tonsils.  No tonsillar enlargement.  No exudate Eyes:     Conjunctiva/sclera: Conjunctivae normal.     Pupils: Pupils are equal, round, and reactive to light.  Neck:     Musculoskeletal: Normal range of motion.     Comments: Tender anterior cervical node at the angle of the jaw on  the left Cardiovascular:     Rate and Rhythm: Normal rate.     Heart sounds: Normal heart sounds.  Pulmonary:     Effort: Pulmonary effort is normal. No respiratory distress.     Breath sounds: Normal breath sounds.  Abdominal:     General: There is no distension.     Palpations: Abdomen is soft.  Musculoskeletal: Normal range of motion.  Lymphadenopathy:     Cervical: Cervical adenopathy present.  Skin:    General: Skin is warm and dry.  Neurological:     Mental Status: She is alert.  Psychiatric:        Mood and Affect: Mood normal.        Behavior: Behavior normal.      UC Treatments / Results  Labs (all labs ordered are listed, but only abnormal results are displayed) Labs Reviewed  NOVEL CORONAVIRUS, NAA (HOSP ORDER, SEND-OUT TO REF LAB; TAT 18-24 HRS)  CULTURE, GROUP A STREP [facility])  POCT RAPID STREP A    EKG   Radiology No results found.  Procedures Procedures (including critical care time)  Medications Ordered in UC Medications - No data to display  Initial Impression / Assessment and Plan / UC Course  I have reviewed the triage vital signs and the nursing notes.  Pertinent labs & imaging results that were available during my care of the patient were reviewed by me and considered in my medical decision making (see chart for details).    Rapid strep is negative.  Cover test is pending.  Discussed the importance of quarantine until these tests are available  Final Clinical Impressions(s) / UC Diagnoses   Final diagnoses:  Acute pharyngitis, unspecified etiology     Discharge Instructions     You have been tested for coronavirus and strep These tests will be available in 2-3 days You may not return to work until tests are confirmed negative You must quarantine at home until the results are negative Take tylenol for pain or fever Drink plenty of fluids   ED Prescriptions    None     Controlled  Substance Prescriptions [place] Controlled  Substance Registry consulted? Not Applicable   Eustace MooreNelson, Yvonne Sue, MD 05/14/19 Nicholos [id number]

## 2019-05-16 LAB — CULTURE, GROUP A STREP (THRC)

## 2019-05-16 LAB — NOVEL CORONAVIRUS, NAA (HOSP ORDER, SEND-OUT TO REF LAB; TAT 18-24 HRS): SARS-CoV-2, NAA: NOT DETECTED

## 2019-09-09 ENCOUNTER — Ambulatory Visit (HOSPITAL_COMMUNITY)
Admission: EM | Admit: 2019-09-09 | Discharge: 2019-09-09 | Disposition: A | Discharge status: Home / Self Care | Payer: BC Managed Care – PPO | Attending: Family Medicine | Admitting: Family Medicine

## 2019-09-09 ENCOUNTER — Encounter (HOSPITAL_COMMUNITY): Payer: Self-pay | Admitting: Emergency Medicine

## 2019-09-09 ENCOUNTER — Other Ambulatory Visit: Payer: Self-pay

## 2019-09-09 DIAGNOSIS — S161XXA Strain of muscle, fascia and tendon at neck level, initial encounter: Secondary | ICD-10-CM | POA: Diagnosis not present

## 2019-09-09 MED ORDER — CYCLOBENZAPRINE HCL 10 MG PO TABS: ORAL_TABLET | ORAL | 0 refills | Status: DC
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
## 2019-09-16 ENCOUNTER — Other Ambulatory Visit: Payer: BC Managed Care – PPO

## 2019-10-06 ENCOUNTER — Encounter (HOSPITAL_COMMUNITY): Payer: Self-pay

## 2019-10-06 ENCOUNTER — Inpatient Hospital Stay (HOSPITAL_COMMUNITY)
Admission: EM | Admit: 2019-10-06 | Discharge: 2019-10-09 | DRG: 863 | Disposition: A | Discharge status: Home / Self Care | Payer: BC Managed Care – PPO | Attending: Internal Medicine | Admitting: Internal Medicine

## 2019-10-06 ENCOUNTER — Emergency Department (HOSPITAL_COMMUNITY): Payer: BC Managed Care – PPO

## 2019-10-06 DIAGNOSIS — I10 Essential (primary) hypertension: Secondary | ICD-10-CM | POA: Diagnosis present

## 2019-10-06 DIAGNOSIS — E876 Hypokalemia: Secondary | ICD-10-CM | POA: Diagnosis not present

## 2019-10-06 DIAGNOSIS — T8140XA Infection following a procedure, unspecified, initial encounter: Principal | ICD-10-CM | POA: Diagnosis present

## 2019-10-06 DIAGNOSIS — D649 Anemia, unspecified: Secondary | ICD-10-CM | POA: Diagnosis present

## 2019-10-06 DIAGNOSIS — R945 Abnormal results of liver function studies: Secondary | ICD-10-CM | POA: Diagnosis present

## 2019-10-06 DIAGNOSIS — D62 Acute posthemorrhagic anemia: Secondary | ICD-10-CM | POA: Diagnosis present

## 2019-10-06 DIAGNOSIS — S301XXA Contusion of abdominal wall, initial encounter: Secondary | ICD-10-CM

## 2019-10-06 DIAGNOSIS — T8144XA Sepsis following a procedure, initial encounter: Secondary | ICD-10-CM | POA: Diagnosis present

## 2019-10-06 DIAGNOSIS — Z20822 Contact with and (suspected) exposure to covid-19: Secondary | ICD-10-CM | POA: Diagnosis present

## 2019-10-06 DIAGNOSIS — L02211 Cutaneous abscess of abdominal wall: Secondary | ICD-10-CM | POA: Diagnosis present

## 2019-10-06 DIAGNOSIS — Y838 Other surgical procedures as the cause of abnormal reaction of the patient, or of later complication, without mention of misadventure at the time of the procedure: Secondary | ICD-10-CM | POA: Diagnosis present

## 2019-10-06 DIAGNOSIS — A419 Sepsis, unspecified organism: Secondary | ICD-10-CM | POA: Diagnosis present

## 2019-10-06 DIAGNOSIS — L7634 Postprocedural seroma of skin and subcutaneous tissue following other procedure: Secondary | ICD-10-CM | POA: Diagnosis present

## 2019-10-06 LAB — LACTIC ACID, PLASMA: Lactic Acid, Venous: 1.4 mmol/L (ref 0.5–1.9)

## 2019-10-06 LAB — PROTIME-INR
INR: 1 (ref 0.8–1.2)
Prothrombin Time: 12.6 seconds (ref 11.4–15.2)

## 2019-10-06 LAB — COMPREHENSIVE METABOLIC PANEL
ALT: 57 U/L — ABNORMAL HIGH (ref 0–44)
AST: 56 U/L — ABNORMAL HIGH (ref 15–41)
Albumin: 3 g/dL — ABNORMAL LOW (ref 3.5–5.0)
Alkaline Phosphatase: 63 U/L (ref 38–126)
Anion gap: 9 (ref 5–15)
BUN: <5 mg/dL — ABNORMAL LOW (ref 6–20)
CO2: 29 mmol/L (ref 22–32)
Calcium: 8.5 mg/dL — ABNORMAL LOW (ref 8.9–10.3)
Chloride: 99 mmol/L (ref 98–111)
Creatinine, Ser: 0.82 mg/dL (ref 0.44–1.00)
GFR calc Af Amer: >60 mL/min (ref >60)
GFR calc non Af Amer: >60 mL/min (ref >60)
Glucose, Bld: 96 mg/dL (ref 70–99)
Potassium: 3.4 mmol/L — ABNORMAL LOW (ref 3.5–5.1)
Sodium: 137 mmol/L (ref 135–145)
Total Bilirubin: 0.8 mg/dL (ref 0.3–1.2)
Total Protein: 6.2 g/dL — ABNORMAL LOW (ref 6.5–8.1)

## 2019-10-06 LAB — URINALYSIS, ROUTINE W REFLEX MICROSCOPIC
Bacteria, UA: NONE SEEN
Bilirubin Urine: NEGATIVE
Glucose, UA: NEGATIVE mg/dL
Hgb urine dipstick: NEGATIVE
Ketones, ur: NEGATIVE mg/dL
Nitrite: NEGATIVE
Protein, ur: NEGATIVE mg/dL
Specific Gravity, Urine: 1.006 (ref 1.005–1.030)
pH: 8 (ref 5.0–8.0)

## 2019-10-06 LAB — CBC WITH DIFFERENTIAL/PLATELET
Abs Immature Granulocytes: 0.08 10*3/uL — ABNORMAL HIGH (ref 0.00–0.07)
Basophils Absolute: 0 10*3/uL (ref 0.0–0.1)
Basophils Relative: 1 %
Eosinophils Absolute: 0.3 10*3/uL (ref 0.0–0.5)
Eosinophils Relative: 3 %
HCT: 23.8 % — ABNORMAL LOW (ref 36.0–46.0)
Hemoglobin: 8 g/dL — ABNORMAL LOW (ref 12.0–15.0)
Immature Granulocytes: 1 %
Lymphocytes Relative: 19 %
Lymphs Abs: 1.6 10*3/uL (ref 0.7–4.0)
MCH: 30.8 pg (ref 26.0–34.0)
MCHC: 33.6 g/dL (ref 30.0–36.0)
MCV: 91.5 fL (ref 80.0–100.0)
Monocytes Absolute: 0.4 10*3/uL (ref 0.1–1.0)
Monocytes Relative: 5 %
Neutro Abs: 6 10*3/uL (ref 1.7–7.7)
Neutrophils Relative %: 71 %
Platelets: 398 10*3/uL (ref 150–400)
RBC: 2.6 MIL/uL — ABNORMAL LOW (ref 3.87–5.11)
RDW: 11.9 % (ref 11.5–15.5)
WBC: 8.4 10*3/uL (ref 4.0–10.5)
nRBC: 0 % (ref 0.0–0.2)

## 2019-10-06 LAB — I-STAT BETA HCG BLOOD, ED (MC, WL, AP ONLY): I-stat hCG, quantitative: <5 m[IU]/mL (ref <5)

## 2019-10-06 LAB — POC OCCULT BLOOD, ED: Fecal Occult Bld: NEGATIVE

## 2019-10-06 MED ORDER — MORPHINE SULFATE (PF) 4 MG/ML IV SOLN
4.0000 mg | Freq: Once | INTRAVENOUS | Status: AC
  Administered 2019-10-07: 4 mg via INTRAVENOUS
  Filled 2019-10-06: qty 1

## 2019-10-06 MED ORDER — ONDANSETRON HCL 4 MG/2ML IJ SOLN
4.0000 mg | Freq: Once | INTRAMUSCULAR | Status: AC
  Administered 2019-10-07: 4 mg via INTRAVENOUS
  Filled 2019-10-06: qty 2

## 2019-10-06 NOTE — ED Provider Notes (Signed)
[facility] EMERGENCY DEPARTMENT Provider Note   CSN: [id number] Arrival date & time: 10/06/19  1949     History Chief Complaint  Patient presents with  . Shortness of Breath    Isabel Cole is a 34 y.o. female.  Patient with PMH of HTN and recent liposuction, who presents to the ED with a chief complaint of SOB.  She had the liposuction and fat transplantation performed in [place].  She does not have follow-up with plastic or general surgery in [place].  She states that over the past few days she has had significant swelling of her abdomen.  She reports pain at the fat transplant sites with associated swelling in her buttocks, perineum, and lower abdomen.  She reports that she has been having a significant amount of fluid draining from her incision sites.  She states that she was getting lymphatic massage therapy in [place] and was have a significant amount of fluid massaged out of the incision sites.  She states that she had also had bleeding as well.  She is no longer having and draining or bleeding.  She states that the SOB worsened today and she states that she feels like she is going to collapse with any exertion.  She tells me that her HGB was 13.8 prior to surgery, but it is noted that her HGB is 8.0 in triage.  She is taking lovenox injections.  She has hx of hemorrhoids, but denies seeing any blood in her stool.  She states that her stools have been green.  Additionally, she reports that prior to returning to [facility], she was seen in the clinic in [facility] and had mildly elevated temp of 100.6.  She denies any fevers since.  The history is provided by the patient. No language interpreter was used.       Past Medical History:  Diagnosis Date  . Gestational hypertension   . Hypertension     There are no problems to display for this patient.   Past Surgical History:  Procedure Laterality Date  . CESAREAN SECTION    . LIPOSUCTION       OB History    Gravida  3   Para  1   Term  0   Preterm  1   AB  1   Living  1     SAB  1   TAB      Ectopic      Multiple      Live Births              Family History  Problem Relation Age of Onset  . Cancer Mother     Social History   Tobacco Use  . Smoking status: Never Smoker  . Smokeless tobacco: Never Used  Substance Use Topics  . Alcohol use: No  . Drug use: No    Home Medications Prior to Admission medications   Medication Sig Start Date End Date Taking? Authorizing Provider  amLODipine (NORVASC) 10 MG tablet Take by mouth. 10/14/18  Yes [provider]  naproxen (NAPROSYN) 500 MG tablet Take by mouth. 07/23/18  Yes [provider]  ciprofloxacin (CIPRO) 500 MG tablet Take 500 mg by mouth 2 (two) times daily. 09/30/19   [provider]  cyclobenzaprine (FLEXERIL) 10 MG tablet Take 1 tablet by mouth 3 times daily as needed for muscle spasm. Warning: May cause drowsiness. 09/09/19   Mardella Layman, MD  enoxaparin (LOVENOX) 40 MG/0.4ML injection SMARTSIG:40 Milligram(s) SUB-Q Daily 09/30/19  [provider]  metoprolol tartrate (LOPRESSOR) 50 MG tablet Take 1 tablet (50 mg total) by mouth once for 1 dose. Take 1 hour prior to test 05/01/18 10/06/19  Buford Dresser, MD  ondansetron (ZOFRAN) 8 MG tablet Take 8 mg by mouth every 6 (six) hours as needed. 09/30/19   [provider]  oxyCODONE-acetaminophen (PERCOCET) 10-325 MG tablet Take 1 tablet by mouth every 4 (four) hours as needed. 09/30/19   [provider]    Allergies    Patient has no known allergies.  Review of Systems   Review of Systems  All other systems reviewed and are negative.   Physical Exam Updated Vital Signs BP 127/71 (BP Location: Left Arm)   Pulse (!) 114   Temp 98.7 F (37.1 C) (Oral)   Resp 19   Wt 80.7 kg   SpO2 100%   BMI 33.63 kg/m   Physical Exam Vitals and nursing note reviewed.  Constitutional:      General: She is not in acute  distress.    Appearance: She is well-developed.  HENT:     Head: Normocephalic and atraumatic.  Eyes:     Conjunctiva/sclera: Conjunctivae normal.  Cardiovascular:     Rate and Rhythm: Regular rhythm. Tachycardia present.     Heart sounds: No murmur.  Pulmonary:     Effort: No respiratory distress.     Breath sounds: Normal breath sounds.     Comments: tachypneic Abdominal:     Palpations: Abdomen is soft.     Tenderness: There is abdominal tenderness.  Musculoskeletal:     Cervical back: Neck supple.  Skin:    General: Skin is warm and dry.     Comments: Incision sites are clean, dry and intact and without evidence of infection Skin of abdomen and buttocks is tight, warm, and tender, but not hot or erythematous.  Neurological:     Mental Status: She is alert and oriented to person, place, and time.  Psychiatric:        Mood and Affect: Mood normal.        Behavior: Behavior normal.     ED Results / Procedures / Treatments   Labs (all labs ordered are listed, but only abnormal results are displayed) Labs Reviewed  COMPREHENSIVE METABOLIC PANEL - Abnormal; Notable for the following components:      Result Value   Potassium 3.4 (*)    BUN <5 (*)    Calcium 8.5 (*)    Total Protein 6.2 (*)    Albumin 3.0 (*)    AST 56 (*)    ALT 57 (*)    All other components within normal limits  CBC WITH DIFFERENTIAL/PLATELET - Abnormal; Notable for the following components:   RBC 2.60 (*)    Hemoglobin 8.0 (*)    HCT 23.8 (*)    Abs Immature Granulocytes 0.08 (*)    All other components within normal limits  URINALYSIS, ROUTINE W REFLEX MICROSCOPIC - Abnormal; Notable for the following components:   APPearance HAZY (*)    Leukocytes,Ua TRACE (*)    All other components within normal limits  CULTURE, BLOOD (ROUTINE X 2)  CULTURE, BLOOD (ROUTINE X 2)  SARS CORONAVIRUS 2 (TAT 6-24 HRS)  LACTIC ACID, PLASMA  LACTIC ACID, PLASMA  PROTIME-INR  BRAIN NATRIURETIC PEPTIDE  I-STAT  BETA HCG BLOOD, ED ([facility], [facility], [facility] ONLY)  POC OCCULT BLOOD, ED  TYPE AND SCREEN  ABO/RH    EKG None  Radiology DG Chest 2  View  Result Date: 10/06/2019 CLINICAL DATA:  Fever and tachycardia EXAM: CHEST - 2 VIEW COMPARISON:  04/01/2018 FINDINGS: The heart size and mediastinal contours are within normal limits. Both lungs are clear. The visualized skeletal structures are unremarkable. IMPRESSION: No active cardiopulmonary disease. Electronically Signed   By: Deatra Robinson M.D.   On: 10/06/2019 20:52   CT Angio Chest PE W and/or Wo Contrast  Result Date: 10/07/2019 CLINICAL DATA:  Abdominal distension.  Shortness of breath EXAM: CT ANGIOGRAPHY CHEST CT ABDOMEN AND PELVIS WITH CONTRAST TECHNIQUE: Multidetector CT imaging of the chest was performed using the standard protocol during bolus administration of intravenous contrast. Multiplanar CT image reconstructions and MIPs were obtained to evaluate the vascular anatomy. Multidetector CT imaging of the abdomen and pelvis was performed using the standard protocol during bolus administration of intravenous contrast. CONTRAST:  OMNIPAQUE IOHEXOL 350 MG/ML SOLN COMPARISON:  None. FINDINGS: CTA CHEST FINDINGS Cardiovascular: Contrast injection is sufficient to demonstrate satisfactory opacification of the pulmonary arteries to the segmental level. There is no pulmonary embolus. The main pulmonary artery is within normal limits for size. There is no CT evidence of acute right heart strain. The visualized aorta is normal. Heart size is normal, without pericardial effusion. Mediastinum/Nodes: --No mediastinal or hilar lymphadenopathy. --No axillary lymphadenopathy. --No supraclavicular lymphadenopathy. --the thyroid gland is enlarged without evidence for distinct thyroid nodule. --The esophagus is unremarkable Lungs/Pleura: No pulmonary nodules or masses. No pleural effusion or pneumothorax. No focal airspace consolidation. No focal pleural abnormality.  Musculoskeletal: No chest wall abnormality. No acute or significant osseous findings. Review of the MIP images confirms the above findings. CT ABDOMEN and PELVIS FINDINGS Hepatobiliary: The liver is normal. Normal gallbladder.There is no biliary ductal dilation. Pancreas: Normal contours without ductal dilatation. No peripancreatic fluid collection. Spleen: No splenic laceration or hematoma. Adrenals/Urinary Tract: --Adrenal glands: No adrenal hemorrhage. --Right kidney/ureter: No hydronephrosis or perinephric hematoma. --Left kidney/ureter: No hydronephrosis or perinephric hematoma. --Urinary bladder: Unremarkable. Stomach/Bowel: --Stomach/Duodenum: No hiatal hernia or other gastric abnormality. Normal duodenal course and caliber. --Small bowel: No dilatation or inflammation. --Colon: No focal abnormality. --Appendix: Normal. Vascular/Lymphatic: Normal course and caliber of the major abdominal vessels. --No retroperitoneal lymphadenopathy. --No mesenteric lymphadenopathy. --there are mildly enlarged inguinal lymph nodes bilaterally favored to be reactive. Reproductive: Unremarkable Other: There is diffuse body wall edema. There are pockets of subcutaneous gas involving the posterior thorax favored to be related to the patient's reported history of recent liposuction. In the anterior abdominal wall. There are fluid collections deep to the subcutaneous fat and superficial to the underlying muscular fascia. There is overlying skin thickening. Musculoskeletal. No acute displaced fractures. Review of the MIP images confirms the above findings. IMPRESSION: 1. No acute pulmonary embolism. 2. The lungs are clear. 3. No acute intra-abdominal process. 4. Sequela of finding involving the thorax and abdomen likely related to the patient's reported history recent liposuction. There are significant subcutaneous fluid collections involving the abdominal wall which may represent postoperative seromas or hematomas. A developing  abscess is not excluded on this exam. Electronically Signed   By: Katherine Mantle M.D.   On: 10/07/2019 00:50   CT ABDOMEN PELVIS W CONTRAST  Result Date: 10/07/2019 CLINICAL DATA:  Abdominal distension.  Shortness of breath EXAM: CT ANGIOGRAPHY CHEST CT ABDOMEN AND PELVIS WITH CONTRAST TECHNIQUE: Multidetector CT imaging of the chest was performed using the standard protocol during bolus administration of intravenous contrast. Multiplanar CT image reconstructions and MIPs were obtained to evaluate the vascular anatomy. Multidetector  CT imaging of the abdomen and pelvis was performed using the standard protocol during bolus administration of intravenous contrast. CONTRAST:  100mL OMNIPAQUE IOHEXOL 350 MG/ML SOLN COMPARISON:  None. FINDINGS: CTA CHEST FINDINGS Cardiovascular: Contrast injection is sufficient to demonstrate satisfactory opacification of the pulmonary arteries to the segmental level. There is no pulmonary embolus. The main pulmonary artery is within normal limits for size. There is no CT evidence of acute right heart strain. The visualized aorta is normal. Heart size is normal, without pericardial effusion. Mediastinum/Nodes: --No mediastinal or hilar lymphadenopathy. --No axillary lymphadenopathy. --No supraclavicular lymphadenopathy. --the thyroid gland is enlarged without evidence for distinct thyroid nodule. --The esophagus is unremarkable Lungs/Pleura: No pulmonary nodules or masses. No pleural effusion or pneumothorax. No focal airspace consolidation. No focal pleural abnormality. Musculoskeletal: No chest wall abnormality. No acute or significant osseous findings. Review of the MIP images confirms the above findings. CT ABDOMEN and PELVIS FINDINGS Hepatobiliary: The liver is normal. Normal gallbladder.There is no biliary ductal dilation. Pancreas: Normal contours without ductal dilatation. No peripancreatic fluid collection. Spleen: No splenic laceration or hematoma. Adrenals/Urinary  Tract: --Adrenal glands: No adrenal hemorrhage. --Right kidney/ureter: No hydronephrosis or perinephric hematoma. --Left kidney/ureter: No hydronephrosis or perinephric hematoma. --Urinary bladder: Unremarkable. Stomach/Bowel: --Stomach/Duodenum: No hiatal hernia or other gastric abnormality. Normal duodenal course and caliber. --Small bowel: No dilatation or inflammation. --Colon: No focal abnormality. --Appendix: Normal. Vascular/Lymphatic: Normal course and caliber of the major abdominal vessels. --No retroperitoneal lymphadenopathy. --No mesenteric lymphadenopathy. --there are mildly enlarged inguinal lymph nodes bilaterally favored to be reactive. Reproductive: Unremarkable Other: There is diffuse body wall edema. There are pockets of subcutaneous gas involving the posterior thorax favored to be related to the patient's reported history of recent liposuction. In the anterior abdominal wall. There are fluid collections deep to the subcutaneous fat and superficial to the underlying muscular fascia. There is overlying skin thickening. Musculoskeletal. No acute displaced fractures. Review of the MIP images confirms the above findings. IMPRESSION: 1. No acute pulmonary embolism. 2. The lungs are clear. 3. No acute intra-abdominal process. 4. Sequela of finding involving the thorax and abdomen likely related to the patient's reported history recent liposuction. There are significant subcutaneous fluid collections involving the abdominal wall which may represent postoperative seromas or hematomas. A developing abscess is not excluded on this exam. Electronically Signed   By: Katherine Mantlehristopher  Green M.D.   On: 10/07/2019 00:50    Procedures Procedures (including critical care time)  Medications Ordered in ED Medications - No data to display  ED Course  I have reviewed the triage vital signs and the nursing notes.  Pertinent labs & imaging results that were available during my care of the patient were reviewed by  me and considered in my medical decision making (see chart for details).    MDM Rules/Calculators/A&P                      Patient here with shortness of breath and fatigue.  Had liposuction last week.  Reports increasing fluid collection in her abdomen, and buttocks.  She states that in [place] she had been having lymphatic massage and having the fluid drained.  She was told that it was illegal for massage therapist to do this in [place].  She states that she cannot exert herself at all without becoming extremely short of breath.  It is noted that her hemoglobin is 8.0 tonight.  She states that her hemoglobin was 13.8 and her preop screening.  Given  the significant drop, will check CTs of her chest and abdomen.  She would be at risk for PE given the recent surgery, but is also taking Lovenox.  This presents concern for bleeding in her abdomen.  CT scan shows fluid collections in the subcutaneous tissues which could be seroma, hematoma, or possible abscess.  I doubt abscess, she is afebrile, nontoxic, and does not have a leukocytosis.  Patient is quite uncomfortable, she is unable to lie down or sit down on her back.  Patient will need admission to the hospital for monitoring of her H&H.    Case discussed with Dr. Bedelia Person, who will have general surgery team follow along.  States nothing to drain right now.    Final Clinical Impression(s) / ED Diagnoses Final diagnoses:  Symptomatic anemia    Rx / DC Orders ED Discharge Orders    None       Roxy Horseman, PA-C 10/07/19 0243    Gilda Crease, MD 10/07/19 [phone]

## 2019-10-06 NOTE — ED Triage Notes (Signed)
Pt reports that last week she went to [place] for a liposuction surgery, went for a follow up and had a fever and tachycardia and SOB, was told to increase fluid, now pt is fluid and swelling from the abdomen down. Weight gain of 10lbs since yesterday

## 2019-10-07 ENCOUNTER — Emergency Department (HOSPITAL_COMMUNITY): Payer: BC Managed Care – PPO

## 2019-10-07 DIAGNOSIS — D649 Anemia, unspecified: Secondary | ICD-10-CM

## 2019-10-07 DIAGNOSIS — R945 Abnormal results of liver function studies: Secondary | ICD-10-CM | POA: Diagnosis present

## 2019-10-07 LAB — COMPREHENSIVE METABOLIC PANEL
ALT: 55 U/L — ABNORMAL HIGH (ref 0–44)
AST: 52 U/L — ABNORMAL HIGH (ref 15–41)
Albumin: 2.7 g/dL — ABNORMAL LOW (ref 3.5–5.0)
Alkaline Phosphatase: 58 U/L (ref 38–126)
Anion gap: 10 (ref 5–15)
BUN: <5 mg/dL — ABNORMAL LOW (ref 6–20)
CO2: 29 mmol/L (ref 22–32)
Calcium: 8.2 mg/dL — ABNORMAL LOW (ref 8.9–10.3)
Chloride: 99 mmol/L (ref 98–111)
Creatinine, Ser: 0.7 mg/dL (ref 0.44–1.00)
GFR calc Af Amer: >60 mL/min (ref >60)
GFR calc non Af Amer: >60 mL/min (ref >60)
Glucose, Bld: 124 mg/dL — ABNORMAL HIGH (ref 70–99)
Potassium: 3.1 mmol/L — ABNORMAL LOW (ref 3.5–5.1)
Sodium: 138 mmol/L (ref 135–145)
Total Bilirubin: 1 mg/dL (ref 0.3–1.2)
Total Protein: 5.9 g/dL — ABNORMAL LOW (ref 6.5–8.1)

## 2019-10-07 LAB — IRON AND TIBC
Iron: 32 ug/dL (ref 28–170)
Saturation Ratios: 13 % (ref 10.4–31.8)
TIBC: 238 ug/dL — ABNORMAL LOW (ref 250–450)
UIBC: 206 ug/dL

## 2019-10-07 LAB — CBC
HCT: 21.2 % — ABNORMAL LOW (ref 36.0–46.0)
Hemoglobin: 7 g/dL — ABNORMAL LOW (ref 12.0–15.0)
MCH: 30.4 pg (ref 26.0–34.0)
MCHC: 33 g/dL (ref 30.0–36.0)
MCV: 92.2 fL (ref 80.0–100.0)
Platelets: 385 10*3/uL (ref 150–400)
RBC: 2.3 MIL/uL — ABNORMAL LOW (ref 3.87–5.11)
RDW: 12.1 % (ref 11.5–15.5)
WBC: 6.8 10*3/uL (ref 4.0–10.5)
nRBC: 0 % (ref 0.0–0.2)

## 2019-10-07 LAB — HEMOGLOBIN AND HEMATOCRIT, BLOOD
HCT: 23.9 % — ABNORMAL LOW (ref 36.0–46.0)
Hemoglobin: 7.9 g/dL — ABNORMAL LOW (ref 12.0–15.0)

## 2019-10-07 LAB — LACTIC ACID, PLASMA
Lactic Acid, Venous: 1.2 mmol/L (ref 0.5–1.9)
Lactic Acid, Venous: 1.3 mmol/L (ref 0.5–1.9)
Lactic Acid, Venous: 1.6 mmol/L (ref 0.5–1.9)

## 2019-10-07 LAB — HIV ANTIBODY (ROUTINE TESTING W REFLEX): HIV Screen 4th Generation wRfx: NONREACTIVE

## 2019-10-07 LAB — VITAMIN B12: Vitamin B-12: 896 pg/mL (ref 180–914)

## 2019-10-07 LAB — FERRITIN: Ferritin: 360 ng/mL — ABNORMAL HIGH (ref 11–307)

## 2019-10-07 LAB — TRANSFERRIN: Transferrin: 170 mg/dL — ABNORMAL LOW (ref 192–382)

## 2019-10-07 LAB — ABO/RH: ABO/RH(D): B POS

## 2019-10-07 LAB — SARS CORONAVIRUS 2 (TAT 6-24 HRS): SARS Coronavirus 2: NEGATIVE

## 2019-10-07 MED ORDER — ONDANSETRON HCL 4 MG PO TABS
8.0000 mg | ORAL_TABLET | Freq: Four times a day (QID) | ORAL | Status: DC | PRN
  Administered 2019-10-07: 8 mg via ORAL
  Filled 2019-10-07: qty 2

## 2019-10-07 MED ORDER — OXYCODONE HCL 5 MG PO TABS
5.0000 mg | ORAL_TABLET | ORAL | Status: DC | PRN
  Administered 2019-10-07 – 2019-10-09 (×8): 5 mg via ORAL
  Filled 2019-10-07 (×8): qty 1

## 2019-10-07 MED ORDER — POTASSIUM CHLORIDE CRYS ER 20 MEQ PO TBCR
20.0000 meq | EXTENDED_RELEASE_TABLET | Freq: Once | ORAL | Status: AC
  Administered 2019-10-07: 20 meq via ORAL
  Filled 2019-10-07: qty 1

## 2019-10-07 MED ORDER — ACETAMINOPHEN 650 MG RE SUPP: 650.0000 mg | Freq: Four times a day (QID) | RECTAL | Status: DC | PRN

## 2019-10-07 MED ORDER — ACETAMINOPHEN 325 MG PO TABS
650.0000 mg | ORAL_TABLET | Freq: Four times a day (QID) | ORAL | Status: DC | PRN
  Administered 2019-10-07: 650 mg via ORAL
  Filled 2019-10-07 (×3): qty 2

## 2019-10-07 MED ORDER — SODIUM CHLORIDE 0.9 % IV SOLN: INTRAVENOUS | Status: DC

## 2019-10-07 MED ORDER — OXYCODONE-ACETAMINOPHEN 5-325 MG PO TABS
1.0000 | ORAL_TABLET | ORAL | Status: DC | PRN
  Administered 2019-10-09: 1 via ORAL
  Filled 2019-10-07 (×2): qty 1

## 2019-10-07 MED ORDER — IOHEXOL 350 MG/ML SOLN
100.0000 mL | Freq: Once | INTRAVENOUS | Status: AC | PRN
  Administered 2019-10-07: 100 mL via INTRAVENOUS

## 2019-10-07 MED ORDER — OXYCODONE-ACETAMINOPHEN 10-325 MG PO TABS: 1.0000 | ORAL_TABLET | ORAL | Status: DC | PRN

## 2019-10-07 NOTE — Plan of Care (Signed)
  Problem: Education: Goal: Knowledge of General Education information will improve Description Including pain rating scale, medication(s)/side effects and non-pharmacologic comfort measures Outcome: Progressing   

## 2019-10-07 NOTE — Progress Notes (Signed)
Pt's MEWS score 3. Dr. Jacqulyn Bath paged, no response. Paged Trauma PA on call. Pt's pulse 116, temperature 101.

## 2019-10-07 NOTE — Consult Note (Signed)
Reason for Consult: abdominal abscess Referring Physician: Dr. Hughie Closs  Isabel Cole is an 34 y.o. female.  HPI: The patient is a 34 yrs old bf here for evaluation after a surgical procedure.  The patient underwent a liposuction with lipofilling to her buttock 1/13 in [place].  She returned to [place] this week and noticed swelling and tenderness in the surgical area.  She had liposuction of the flanks, abdomen with fat filling to the buttock.  She presented to the ED 1/19 and was found to be tachycardic and febrile.  She was admitted this morning and I was called at 4pm.  On exam the patient was in her room standing eating from her tray. She was uncomfortable but not in distress.  There was not sign of cellulitis.  She was swollen as expected in the buttock, upper legs and abdomen.  She does not appear to have infection from the exam.  She is tender but in proportion to the procedure.  She has a seroma on the left abdomen.  She did not have her compression garment on as she felt it was creating issues due to the swelling.  Past Medical History:  Diagnosis Date  . Gestational hypertension   . Hypertension     Past Surgical History:  Procedure Laterality Date  . CESAREAN SECTION    . LIPOSUCTION      Family History  Problem Relation Age of Onset  . Cancer Mother     Social History:  reports that she has never smoked. She has never used smokeless tobacco. She reports that she does not drink alcohol or use drugs.  Allergies: No Known Allergies  Medications: I have reviewed the patient's current medications.  Results for orders placed or performed during the hospital encounter of 10/06/19 (from the past 48 hour(s))  Culture, blood (Routine x 2)     Status: None (Preliminary result)   Collection Time: 10/06/19  8:00 PM   Specimen: BLOOD RIGHT ARM  Result Value Ref Range   Specimen Description BLOOD RIGHT ARM    Special Requests      BOTTLES DRAWN AEROBIC AND ANAEROBIC Blood Culture  results may not be optimal due to an excessive volume of blood received in culture bottles   Culture      NO GROWTH < 12 HOURS Performed at [facility], [address], [place], [place] [address]    Report Status PENDING   Urinalysis, Routine w reflex microscopic     Status: Abnormal   Collection Time: 10/06/19  8:02 PM  Result Value Ref Range   Color, Urine YELLOW YELLOW   APPearance HAZY (A) CLEAR   Specific Gravity, Urine 1.006 1.005 - 1.030   pH 8.0 5.0 - 8.0   Glucose, UA NEGATIVE NEGATIVE mg/dL   Hgb urine dipstick NEGATIVE NEGATIVE   Bilirubin Urine NEGATIVE NEGATIVE   Ketones, ur NEGATIVE NEGATIVE mg/dL   Protein, ur NEGATIVE NEGATIVE mg/dL   Nitrite NEGATIVE NEGATIVE   Leukocytes,Ua TRACE (A) NEGATIVE   RBC / HPF 0-5 0 - 5 RBC/hpf   WBC, UA 0-5 0 - 5 WBC/hpf   Bacteria, UA NONE SEEN NONE SEEN   Squamous Epithelial / LPF 0-5 0 - 5    Comment: Performed at [facility], [address], [place], [place] [address]  Comprehensive metabolic panel     Status: Abnormal   Collection Time: 10/06/19  8:17 PM  Result Value Ref Range   Sodium 137 135 - 145  mmol/L   Potassium 3.4 (L) 3.5 - 5.1 mmol/L   Chloride 99 98 - 111 mmol/L   CO2 29 22 - 32 mmol/L   Glucose, Bld 96 70 - 99 mg/dL   BUN <5 (L) 6 - 20 mg/dL   Creatinine, Ser 0.82 0.44 - 1.00 mg/dL   Calcium 8.5 (L) 8.9 - 10.3 mg/dL   Total Protein 6.2 (L) 6.5 - 8.1 g/dL   Albumin 3.0 (L) 3.5 - 5.0 g/dL   AST 56 (H) 15 - 41 U/L   ALT 57 (H) 0 - 44 U/L   Alkaline Phosphatase 63 38 - 126 U/L   Total Bilirubin 0.8 0.3 - 1.2 mg/dL   GFR calc non Af Amer >60 >60 mL/min   GFR calc Af Amer >60 >60 mL/min   Anion gap 9 5 - 15    Comment: Performed at [facility], [place] [address], [place], [place] [address]  Lactic acid, plasma     Status: None   Collection Time: 10/06/19  8:17 PM  Result Value Ref Range   Lactic Acid, Venous 1.4 0.5 - 1.9 mmol/L    Comment: Performed at [facility] [address] [place], [place] [address]  CBC with Differential     Status: Abnormal   Collection Time: 10/06/19  8:17 PM  Result Value Ref Range   WBC 8.4 4.0 - 10.5 K/uL   RBC 2.60 (L) 3.87 - 5.11 MIL/uL   Hemoglobin 8.0 (L) 12.0 - 15.0 g/dL   HCT 23.8 (L) 36.0 - 46.0 %   MCV 91.5 80.0 - 100.0 fL   MCH 30.8 26.0 - 34.0 pg   MCHC 33.6 30.0 - 36.0 g/dL   RDW 11.9 11.5 - 15.5 %   Platelets 398 150 - 400 K/uL   nRBC 0.0 0.0 - 0.2 %   Neutrophils Relative % 71 %   Neutro Abs 6.0 1.7 - 7.7 K/uL   Lymphocytes Relative 19 %   Lymphs Abs 1.6 0.7 - 4.0 K/uL   Monocytes Relative 5 %   Monocytes Absolute 0.4 0.1 - 1.0 K/uL   Eosinophils Relative 3 %   Eosinophils Absolute 0.3 0.0 - 0.5 K/uL   Basophils Relative 1 %   Basophils Absolute 0.0 0.0 - 0.1 K/uL   Immature Granulocytes 1 %   Abs Immature Granulocytes 0.08 (H) 0.00 - 0.07 K/uL    Comment: Performed at [facility] [address], [place], [place] [address]  Protime-INR     Status: None   Collection Time: 10/06/19  8:17 PM  Result Value Ref Range   Prothrombin Time 12.6 11.4 - 15.2 seconds   INR 1.0 0.8 - 1.2    Comment: (NOTE) INR goal varies based on device and disease states. Performed at [facility], [place] [address], [place], [place] [address]   Culture, blood (Routine x 2)     Status: None (Preliminary result)   Collection Time: 10/06/19  8:22 PM   Specimen: BLOOD LEFT ARM  Result Value Ref Range   Specimen Description BLOOD LEFT ARM    Special Requests      BOTTLES DRAWN AEROBIC ONLY Blood Culture results may not be optimal due to an excessive volume of blood received in culture bottles   Culture      NO GROWTH < 12 HOURS Performed at [facility] [address] [place],  [address]    Report Status PENDING   I-Stat  beta hCG blood, ED     Status: None   Collection Time: 10/06/19  8:31 PM  Result Value Ref Range   I-stat hCG, quantitative <5.0 <5 mIU/mL   Comment 3            Comment:   GEST. AGE       CONC.  (mIU/mL)   <=1 WEEK        5 - 50     2 WEEKS       50 - 500     3 WEEKS       100 - 10,000     4 WEEKS     1,000 - 30,000        FEMALE AND NON-PREGNANT FEMALE:     LESS THAN 5 mIU/mL   POC occult blood, ED Provider will collect     Status: None   Collection Time: 10/06/19 10:46 PM  Result Value Ref Range   Fecal Occult Bld NEGATIVE NEGATIVE  Type and screen     Status: None   Collection Time: 10/06/19 11:25 PM  Result Value Ref Range   ABO/RH(D) B POS    Antibody Screen NEG    Sample Expiration      10/09/2019,2359 Performed at [facility], [address] [place], [place] [address]   ABO/Rh     Status: None   Collection Time: 10/06/19 11:25 PM  Result Value Ref Range   ABO/RH(D)      B POS Performed at [facility], [address] [place], [place] [address]   Lactic acid, plasma     Status: None   Collection Time: 10/06/19 11:33 PM  Result Value Ref Range   Lactic Acid, Venous 1.6 0.5 - 1.9 mmol/L    Comment: Performed at [facility], [address], [place], [place] [address]  SARS CORONAVIRUS 2 (TAT 6-24 HRS) Nasopharyngeal Nasopharyngeal Swab     Status: None   Collection Time: 10/07/19 12:10 AM   Specimen: Nasopharyngeal Swab  Result Value Ref Range   SARS Coronavirus 2 NEGATIVE NEGATIVE    Comment: (NOTE) SARS-CoV-2 target nucleic acids are NOT DETECTED. The SARS-CoV-2 RNA is generally detectable in upper and lower respiratory specimens during the acute phase of infection. Negative results do not preclude SARS-CoV-2 infection, do not rule out co-infections with other pathogens, and should not be used as the sole basis for treatment or other patient management decisions. Negative results must be combined with clinical observations, patient history, and epidemiological information. The expected result is Negative. Fact Sheet for Patients: HairSlick.no Fact Sheet for Healthcare  Providers: quierodirigir.com This test is not yet approved or cleared by the [place] FDA and  has been authorized for detection and/or diagnosis of SARS-CoV-2 by FDA under an Emergency Use Authorization (EUA). This EUA will remain  in effect (meaning this test can be used) for the duration of the COVID-19 declaration under Section 56 4(b)(1) of the Act, 21 U.S.C. section 360bbb-3(b)(1), unless the authorization is terminated or revoked sooner. Performed at [facility], [address] [place], [place] [address]   HIV Antibody (routine testing w rflx)     Status: None   Collection Time: 10/07/19  4:06 AM  Result Value Ref Range   HIV Screen 4th Generation wRfx NON REACTIVE NON REACTIVE    Comment: Performed at [facility], [address] [place], [place] [address]  Comprehensive metabolic panel     Status:  Abnormal   Collection Time: 10/07/19  4:06 AM  Result Value Ref Range   Sodium 138 135 - 145 mmol/L   Potassium 3.1 (L) 3.5 - 5.1 mmol/L   Chloride 99 98 - 111 mmol/L   CO2 29 22 - 32 mmol/L   Glucose, Bld 124 (H) 70 - 99 mg/dL   BUN <5 (L) 6 - 20 mg/dL   Creatinine, Ser 1.610.70 0.44 - 1.00 mg/dL   Calcium 8.2 (L) 8.9 - 10.3 mg/dL   Total Protein 5.9 (L) 6.5 - 8.1 g/dL   Albumin 2.7 (L) 3.5 - 5.0 g/dL   AST 52 (H) 15 - 41 U/L   ALT 55 (H) 0 - 44 U/L   Alkaline Phosphatase 58 38 - 126 U/L   Total Bilirubin 1.0 0.3 - 1.2 mg/dL   GFR calc non Af Amer >60 >60 mL/min   GFR calc Af Amer >60 >60 mL/min   Anion gap 10 5 - 15    Comment: Performed at [facility], [address] [place], [place] [address]  CBC     Status: Abnormal   Collection Time: 10/07/19  4:06 AM  Result Value Ref Range   WBC 6.8 4.0 - 10.5 K/uL   RBC 2.30 (L) 3.87 - 5.11 MIL/uL   Hemoglobin 7.0 (L) 12.0 - 15.0 g/dL   HCT 54.021.2 (L) 98.136.0 - 19.146.0 %   MCV 92.2 80.0 - 100.0 fL   MCH 30.4 26.0 - 34.0 pg   MCHC 33.0 30.0 - 36.0 g/dL   RDW 47.812.1 29.511.5 - 62.115.5 %    Platelets 385 150 - 400 K/uL   nRBC 0.0 0.0 - 0.2 %    Comment: Performed at [facility], [address] [place], [place] [address]  Iron and TIBC     Status: Abnormal   Collection Time: 10/07/19  8:24 AM  Result Value Ref Range   Iron 32 28 - 170 ug/dL   TIBC 784238 (L) 696250 - 295450 ug/dL   Saturation Ratios 13 10.4 - 31.8 %   UIBC 206 ug/dL    Comment: Performed at [facility], [address] [place], [place] [address]  Ferritin     Status: Abnormal   Collection Time: 10/07/19  8:24 AM  Result Value Ref Range   Ferritin 360 (H) 11 - 307 ng/mL    Comment: Performed at [facility], [address] [place], [place] [address]  Transferrin     Status: Abnormal   Collection Time: 10/07/19  8:24 AM  Result Value Ref Range   Transferrin 170 (L) 192 - 382 mg/dL    Comment: Performed at [facility], [address] [place], [place] [address]  Vitamin B12     Status: None   Collection Time: 10/07/19  8:24 AM  Result Value Ref Range   Vitamin B-12 896 180 - 914 pg/mL    Comment: (NOTE) This assay is not validated for testing neonatal or myeloproliferative syndrome specimens for Vitamin B12 levels. Performed at [facility], [address] [place], [place] [address]   Lactic acid, plasma     Status: None   Collection Time: 10/07/19  4:13 PM  Result Value Ref Range   Lactic Acid, Venous 1.3 0.5 - 1.9 mmol/L    Comment: Performed at [facility], [address] [place], [place] [address]  Hemoglobin and hematocrit, blood     Status: Abnormal   Collection  Time: 10/07/19  7:07 PM  Result Value Ref Range   Hemoglobin 7.9 (L) 12.0 - 15.0 g/dL   HCT 60.423.9 (L) 54.036.0 - 98.146.0 %    Comment: Performed at [facility], [address] [place], [place] [address]  Lactic acid, plasma     Status: None   Collection Time: 10/07/19  7:07 PM  Result Value Ref Range   Lactic Acid, Venous 1.2 0.5 - 1.9 mmol/L    Comment: Performed at [facility], [address] [place], [place] [address]    DG Chest 2 View  Result Date: 10/06/2019 CLINICAL DATA:  Fever and tachycardia EXAM: CHEST - 2 VIEW COMPARISON:  04/01/2018 FINDINGS: The heart size and mediastinal contours are within normal limits. Both lungs are clear. The visualized skeletal structures are unremarkable. IMPRESSION: No active cardiopulmonary disease. Electronically Signed   By: Deatra RobinsonKevin  Herman M.D.   On: 10/06/2019 20:52   CT Angio Chest PE W and/or Wo Contrast  Result Date: 10/07/2019 CLINICAL DATA:  Abdominal distension.  Shortness of breath EXAM: CT ANGIOGRAPHY CHEST CT ABDOMEN AND PELVIS WITH CONTRAST TECHNIQUE: Multidetector CT imaging of the chest was performed using the standard protocol during bolus administration of intravenous contrast. Multiplanar CT image reconstructions and MIPs were obtained to evaluate the vascular anatomy. Multidetector CT imaging of the abdomen and pelvis was performed using the standard protocol during bolus administration of intravenous contrast. CONTRAST:  100mL OMNIPAQUE IOHEXOL 350 MG/ML SOLN COMPARISON:  None. FINDINGS: CTA CHEST FINDINGS Cardiovascular: Contrast injection is sufficient to demonstrate satisfactory opacification of the pulmonary arteries to the segmental level. There is no pulmonary embolus. The main pulmonary artery is within normal limits for size. There is no CT evidence of acute right heart strain. The visualized aorta is normal. Heart size is normal, without pericardial effusion. Mediastinum/Nodes: --No mediastinal or hilar lymphadenopathy. --No axillary lymphadenopathy. --No supraclavicular lymphadenopathy. --the thyroid gland is enlarged without evidence for distinct thyroid nodule. --The esophagus is unremarkable Lungs/Pleura: No pulmonary nodules or masses. No pleural effusion or pneumothorax. No focal airspace consolidation. No focal pleural abnormality. Musculoskeletal: No chest wall abnormality. No acute or significant osseous findings.  Review of the MIP images confirms the above findings. CT ABDOMEN and PELVIS FINDINGS Hepatobiliary: The liver is normal. Normal gallbladder.There is no biliary ductal dilation. Pancreas: Normal contours without ductal dilatation. No peripancreatic fluid collection. Spleen: No splenic laceration or hematoma. Adrenals/Urinary Tract: --Adrenal glands: No adrenal hemorrhage. --Right kidney/ureter: No hydronephrosis or perinephric hematoma. --Left kidney/ureter: No hydronephrosis or perinephric hematoma. --Urinary bladder: Unremarkable. Stomach/Bowel: --Stomach/Duodenum: No hiatal hernia or other gastric abnormality. Normal duodenal course and caliber. --Small bowel: No dilatation or inflammation. --Colon: No focal abnormality. --Appendix: Normal. Vascular/Lymphatic: Normal course and caliber of the major abdominal vessels. --No retroperitoneal lymphadenopathy. --No mesenteric lymphadenopathy. --there are mildly enlarged inguinal lymph nodes bilaterally favored to be reactive. Reproductive: Unremarkable Other: There is diffuse body wall edema. There are pockets of subcutaneous gas involving the posterior thorax favored to be related to the patient's reported history of recent liposuction. In the anterior abdominal wall. There are fluid collections deep to the subcutaneous fat and superficial to the underlying muscular fascia. There is overlying skin thickening. Musculoskeletal. No acute displaced fractures. Review of the MIP images confirms the above findings. IMPRESSION: 1. No acute pulmonary embolism. 2. The lungs are clear. 3. No acute intra-abdominal process. 4. Sequela of finding involving the thorax and abdomen likely related to the patient's reported history recent liposuction. There are  significant subcutaneous fluid collections involving the abdominal wall which may represent postoperative seromas or hematomas. A developing abscess is not excluded on this exam. Electronically Signed   By: Katherine Mantle M.D.    On: 10/07/2019 00:50   CT ABDOMEN PELVIS W CONTRAST  Result Date: 10/07/2019 CLINICAL DATA:  Abdominal distension.  Shortness of breath EXAM: CT ANGIOGRAPHY CHEST CT ABDOMEN AND PELVIS WITH CONTRAST TECHNIQUE: Multidetector CT imaging of the chest was performed using the standard protocol during bolus administration of intravenous contrast. Multiplanar CT image reconstructions and MIPs were obtained to evaluate the vascular anatomy. Multidetector CT imaging of the abdomen and pelvis was performed using the standard protocol during bolus administration of intravenous contrast. CONTRAST:  OMNIPAQUE IOHEXOL 350 MG/ML SOLN COMPARISON:  None. FINDINGS: CTA CHEST FINDINGS Cardiovascular: Contrast injection is sufficient to demonstrate satisfactory opacification of the pulmonary arteries to the segmental level. There is no pulmonary embolus. The main pulmonary artery is within normal limits for size. There is no CT evidence of acute right heart strain. The visualized aorta is normal. Heart size is normal, without pericardial effusion. Mediastinum/Nodes: --No mediastinal or hilar lymphadenopathy. --No axillary lymphadenopathy. --No supraclavicular lymphadenopathy. --the thyroid gland is enlarged without evidence for distinct thyroid nodule. --The esophagus is unremarkable Lungs/Pleura: No pulmonary nodules or masses. No pleural effusion or pneumothorax. No focal airspace consolidation. No focal pleural abnormality. Musculoskeletal: No chest wall abnormality. No acute or significant osseous findings. Review of the MIP images confirms the above findings. CT ABDOMEN and PELVIS FINDINGS Hepatobiliary: The liver is normal. Normal gallbladder.There is no biliary ductal dilation. Pancreas: Normal contours without ductal dilatation. No peripancreatic fluid collection. Spleen: No splenic laceration or hematoma. Adrenals/Urinary Tract: --Adrenal glands: No adrenal hemorrhage. --Right kidney/ureter: No hydronephrosis or  perinephric hematoma. --Left kidney/ureter: No hydronephrosis or perinephric hematoma. --Urinary bladder: Unremarkable. Stomach/Bowel: --Stomach/Duodenum: No hiatal hernia or other gastric abnormality. Normal duodenal course and caliber. --Small bowel: No dilatation or inflammation. --Colon: No focal abnormality. --Appendix: Normal. Vascular/Lymphatic: Normal course and caliber of the major abdominal vessels. --No retroperitoneal lymphadenopathy. --No mesenteric lymphadenopathy. --there are mildly enlarged inguinal lymph nodes bilaterally favored to be reactive. Reproductive: Unremarkable Other: There is diffuse body wall edema. There are pockets of subcutaneous gas involving the posterior thorax favored to be related to the patient's reported history of recent liposuction. In the anterior abdominal wall. There are fluid collections deep to the subcutaneous fat and superficial to the underlying muscular fascia. There is overlying skin thickening. Musculoskeletal. No acute displaced fractures. Review of the MIP images confirms the above findings. IMPRESSION: 1. No acute pulmonary embolism. 2. The lungs are clear. 3. No acute intra-abdominal process. 4. Sequela of finding involving the thorax and abdomen likely related to the patient's reported history recent liposuction. There are significant subcutaneous fluid collections involving the abdominal wall which may represent postoperative seromas or hematomas. A developing abscess is not excluded on this exam. Electronically Signed   By: Katherine Mantle M.D.   On: 10/07/2019 00:50    Review of Systems  Constitutional: Positive for activity change. Negative for appetite change.  Eyes: Negative.   Respiratory: Negative for chest tightness and shortness of breath.   Gastrointestinal: Positive for abdominal distention and abdominal pain.  Endocrine: Negative.   Genitourinary: Negative.   Skin: Positive for color change.  Hematological: Negative.    Psychiatric/Behavioral: Negative.    Blood pressure 130/72, pulse (!) 105, temperature 98.9 F (37.2 C), temperature source Oral, resp. rate 18, weight 80.7 kg, SpO2 99 %.  Physical Exam  Constitutional: She is oriented to person, place, and time. She appears well-developed and well-nourished.  HENT:  Head: Normocephalic and atraumatic.  Eyes: Pupils are equal, round, and reactive to light. EOM are normal.  Respiratory: Effort normal. No respiratory distress.  GI: Soft. She exhibits distension. There is abdominal tenderness. There is no rebound.  Musculoskeletal:        General: Tenderness and edema present.  Neurological: She is alert and oriented to person, place, and time.  Skin: Skin is warm. No erythema.    Assessment/Plan: Seroma of abdomen after liposuction.  Recommend compression garment to be worn.  IR drainage of the seroma and placement of a drain if needed.  Continue hydration and increase protein intake.  Follow up with operating surgeon. Above discussed with Dr. Jacqulyn Bath.  Thank you for the consult.  Alena Bills Franchelle Foskett 10/07/2019, 8:57 PM

## 2019-10-07 NOTE — Consult Note (Signed)
Isabel Cole 01-Oct-1985  [id number].    Requesting MD: Dr. Jacqulyn Bath Chief Complaint: SOB, Dizziness, Fatigue Reason for Consult: s/p Liposuction and [place] Butt Lift w/ subcutaneous fluid collections involving the abdominal wall which may represent postoperative seromas or hematomas or developing abscesses   HPI: Isabel Cole is a 34 y.o. female with a hx of HTN who recently underwent liposuction and fat transportation into her buttocks and [place] on 1/13 by Dr. Thana Ates. Since arriving to the hospital she was found to be anemic and found to have subcutaneous fluid collections involving the abdominal wall.   Patient reports that she arrived to [place] on 1/12 for preop.  At that time her hemoglobin was 13.8.  She underwent the procedure as stated above on 1/13. She has been taking PO Cipro BID and daily 40mg  Lovenox injections to her thighs post-operatively. She reports she has also been receiving daily lymphatic massage therapy.  She reports that the her incisions had to be probed on 1/15 with a sterile cotton swab 2/2 swelling which express a large amount of bloody drainage. At that time she had a fever of 100.6.  She has not had any further fever or drainage since that time and her incision sites have reclosed.  She went to a follow-up appointment on 1/18 where she was cleared to return home.  She flew back that day.  She reports since that time she has been fatigued, short of breath and lightheaded with activity.  She reports she has to lay down constantly due to the fatigue. Her HR at home has been ~120.  She noted that she had some swelling to her lower abdomen as well as her upper legs.  She reached out to her surgeon and sent a picture of her abdomen who felt this may be a seroma and recommended her going to the emergency room for evaluation.  Since arrival to [facility] she has been afebrile and tachycardic, with HR between 100-129.  She was noted to be anemic with a  hemoglobin of 8.0 yesterday and 7.0 this morning.  The patient denies any hematochezia, melena or hematemesis.  She underwent a CTA in the ED that was negative for PE. CTAP showed subcutaneous fluid collections involving the abdominal wall which may represent postoperative seromas or hematomas or developing abscesses. WBC 6.8. Patient was admitted to medicine service and general surgery was asked to see.   Patient has hx of 2 prior C-Sections. She denies tobacco or illicit drug use. She drinks socially. She is a [employer]. Lives at home with her 2 sons, ages 53 and 67.  ROS: Review of Systems  Constitutional: Positive for chills, fever and malaise/fatigue.  Respiratory: Positive for shortness of breath.   Cardiovascular: Negative for chest pain and leg swelling.  Gastrointestinal: Positive for abdominal pain. Negative for blood in stool, constipation, diarrhea, melena, nausea and vomiting.  Genitourinary: Negative for dysuria.  Musculoskeletal: Positive for myalgias.  Neurological: Positive for dizziness.  All other systems reviewed and are negative.   Family History  Problem Relation Age of Onset  . Cancer Mother     Past Medical History:  Diagnosis Date  . Gestational hypertension   . Hypertension     Past Surgical History:  Procedure Laterality Date  . CESAREAN SECTION    . LIPOSUCTION      Social History:  reports that she has never smoked. She has never used smokeless tobacco. She reports that she does not drink alcohol or use  drugs.  Allergies: No Known Allergies  Medications Prior to Admission  Medication Sig Dispense Refill  . enoxaparin (LOVENOX) 40 MG/0.4ML injection Inject 40 mg into the skin daily.     . ondansetron (ZOFRAN) 8 MG tablet Take 8 mg by mouth every 6 (six) hours as needed for nausea or vomiting.     Marland Kitchen oxyCODONE-acetaminophen (PERCOCET) 10-325 MG tablet Take 1 tablet by mouth every 4 (four) hours as needed.       Physical Exam: Blood pressure (!) 139/91,  pulse (!) 119, temperature 99 F (37.2 C), temperature source Oral, resp. rate 17, weight 80.7 kg, SpO2 98 %. Physical Exam  Constitutional: She is oriented to person, place, and time and well-developed, well-nourished, and in no distress. No distress.  HENT:  Head: Normocephalic and atraumatic.  Right Ear: External ear normal.  Left Ear: External ear normal.  Nose: Nose normal.  Mouth/Throat: Uvula is midline, oropharynx is clear and moist and mucous membranes are normal.  Eyes: Pupils are equal, round, and reactive to light. Conjunctivae, EOM and lids are normal.  Neck: Trachea normal.  Cardiovascular: Regular rhythm, normal heart sounds and normal pulses. Tachycardia present.  No murmur heard. Pulses:      Radial pulses are 2+ on the right side and 2+ on the left side.       Dorsalis pedis pulses are 2+ on the right side and 2+ on the left side.  Pulmonary/Chest: Effort normal and breath sounds normal. No tachypnea. No respiratory distress. She has no decreased breath sounds. She has no wheezes. She has no rhonchi. She has no rales.  Abdominal: Normal appearance and bowel sounds are normal. She exhibits no distension. There is abdominal tenderness in the periumbilical area and suprapubic area. There is no rigidity, no rebound and no guarding.    Picture depicts prior C-section scar that is well healed as well as 5 small incisions from her recent surgery that area closed, clean, dry and intact without surrounding erythema, heat or induration.  There is a 3x4cm area of firmness below her umbilicus without overlying skin erythema or heat. No obvious fluctuance or seroma.   Musculoskeletal:        General: Normal range of motion.     Cervical back: Normal range of motion and neck supple.  Neurological: She is alert and oriented to person, place, and time. No cranial nerve deficit. Gait normal.  Skin: Skin is warm, dry and intact.  Psychiatric: Mood, memory, affect and judgment normal.    Nursing note and vitals reviewed.    Results for orders placed or performed during the hospital encounter of 10/06/19 (from the past 48 hour(s))  Culture, blood (Routine x 2)     Status: None (Preliminary result)   Collection Time: 10/06/19  8:00 PM   Specimen: BLOOD RIGHT ARM  Result Value Ref Range   Specimen Description BLOOD RIGHT ARM    Special Requests      BOTTLES DRAWN AEROBIC AND ANAEROBIC Blood Culture results may not be optimal due to an excessive volume of blood received in culture bottles   Culture      NO GROWTH < 12 HOURS Performed at [facility] [address], [place], [place] [address]    Report Status PENDING   Urinalysis, Routine w reflex microscopic     Status: Abnormal   Collection Time: 10/06/19  8:02 PM  Result Value Ref Range   Color, Urine YELLOW YELLOW   APPearance HAZY (A) CLEAR  Specific Gravity, Urine 1.006 1.005 - 1.030   pH 8.0 5.0 - 8.0   Glucose, UA NEGATIVE NEGATIVE mg/dL   Hgb urine dipstick NEGATIVE NEGATIVE   Bilirubin Urine NEGATIVE NEGATIVE   Ketones, ur NEGATIVE NEGATIVE mg/dL   Protein, ur NEGATIVE NEGATIVE mg/dL   Nitrite NEGATIVE NEGATIVE   Leukocytes,Ua TRACE (A) NEGATIVE   RBC / HPF 0-5 0 - 5 RBC/hpf   WBC, UA 0-5 0 - 5 WBC/hpf   Bacteria, UA NONE SEEN NONE SEEN   Squamous Epithelial / LPF 0-5 0 - 5    Comment: Performed at [facility], [address] [place], [place] [address]  Comprehensive metabolic panel     Status: Abnormal   Collection Time: 10/06/19  8:17 PM  Result Value Ref Range   Sodium 137 135 - 145 mmol/L   Potassium 3.4 (L) 3.5 - 5.1 mmol/L   Chloride 99 98 - 111 mmol/L   CO2 29 22 - 32 mmol/L   Glucose, Bld 96 70 - 99 mg/dL   BUN <5 (L) 6 - 20 mg/dL   Creatinine, Ser 6.040.82 0.44 - 1.00 mg/dL   Calcium 8.5 (L) 8.9 - 10.3 mg/dL   Total Protein 6.2 (L) 6.5 - 8.1 g/dL   Albumin 3.0 (L) 3.5 - 5.0 g/dL   AST 56 (H) 15 - 41 U/L   ALT 57 (H) 0 - 44 U/L   Alkaline Phosphatase 63 38 - 126 U/L    Total Bilirubin 0.8 0.3 - 1.2 mg/dL   GFR calc non Af Amer >60 >60 mL/min   GFR calc Af Amer >60 >60 mL/min   Anion gap 9 5 - 15    Comment: Performed at [facility], [address] [place], [place] [address]  Lactic acid, plasma     Status: None   Collection Time: 10/06/19  8:17 PM  Result Value Ref Range   Lactic Acid, Venous 1.4 0.5 - 1.9 mmol/L    Comment: Performed at [facility], [address] [place], [place] [address]  CBC with Differential     Status: Abnormal   Collection Time: 10/06/19  8:17 PM  Result Value Ref Range   WBC 8.4 4.0 - 10.5 K/uL   RBC 2.60 (L) 3.87 - 5.11 MIL/uL   Hemoglobin 8.0 (L) 12.0 - 15.0 g/dL   HCT 78.223.8 (L) 95.636.0 - 21.346.0 %   MCV 91.5 80.0 - 100.0 fL   MCH 30.8 26.0 - 34.0 pg   MCHC 33.6 30.0 - 36.0 g/dL   RDW 08.611.9 57.811.5 - 46.915.5 %   Platelets 398 150 - 400 K/uL   nRBC 0.0 0.0 - 0.2 %   Neutrophils Relative % 71 %   Neutro Abs 6.0 1.7 - 7.7 K/uL   Lymphocytes Relative 19 %   Lymphs Abs 1.6 0.7 - 4.0 K/uL   Monocytes Relative 5 %   Monocytes Absolute 0.4 0.1 - 1.0 K/uL   Eosinophils Relative 3 %   Eosinophils Absolute 0.3 0.0 - 0.5 K/uL   Basophils Relative 1 %   Basophils Absolute 0.0 0.0 - 0.1 K/uL   Immature Granulocytes 1 %   Abs Immature Granulocytes 0.08 (H) 0.00 - 0.07 K/uL    Comment: Performed at [facility], [address] [place], [place] [address]  Protime-INR     Status: None   Collection Time: 10/06/19  8:17 PM  Result Value Ref Range   Prothrombin Time 12.6 11.4 - 15.2  seconds   INR 1.0 0.8 - 1.2    Comment: (NOTE) INR goal varies based on device and disease states. Performed at [facility], [address] [place], [place] [address]   Culture, blood (Routine x 2)     Status: None (Preliminary result)   Collection Time: 10/06/19  8:22 PM   Specimen: BLOOD LEFT ARM  Result Value Ref Range   Specimen Description BLOOD LEFT ARM    Special Requests      BOTTLES DRAWN AEROBIC ONLY Blood Culture  results may not be optimal due to an excessive volume of blood received in culture bottles   Culture      NO GROWTH < 12 HOURS Performed at [facility], [address] [place], [place] [address]    Report Status PENDING   I-Stat beta hCG blood, ED     Status: None   Collection Time: 10/06/19  8:31 PM  Result Value Ref Range   I-stat hCG, quantitative <5.0 <5 mIU/mL   Comment 3            Comment:   GEST. AGE      CONC.  (mIU/mL)   <=1 WEEK        5 - 50     2 WEEKS       50 - 500     3 WEEKS       100 - 10,000     4 WEEKS     1,000 - 30,000        FEMALE AND NON-PREGNANT FEMALE:     LESS THAN 5 mIU/mL   POC occult blood, ED Provider will collect     Status: None   Collection Time: 10/06/19 10:46 PM  Result Value Ref Range   Fecal Occult Bld NEGATIVE NEGATIVE  Type and screen     Status: None   Collection Time: 10/06/19 11:25 PM  Result Value Ref Range   ABO/RH(D) B POS    Antibody Screen NEG    Sample Expiration      10/09/2019,2359 Performed at [facility], [address] [place], [place] [address]   ABO/Rh     Status: None   Collection Time: 10/06/19 11:25 PM  Result Value Ref Range   ABO/RH(D)      B POS Performed at [facility], [address], [place], [place] [address]   Lactic acid, plasma     Status: None   Collection Time: 10/06/19 11:33 PM  Result Value Ref Range   Lactic Acid, Venous 1.6 0.5 - 1.9 mmol/L    Comment: Performed at [facility], [address], [place], [place] [address]  SARS CORONAVIRUS 2 (TAT 6-24 HRS) Nasopharyngeal Nasopharyngeal Swab     Status: None   Collection Time: 10/07/19 12:10 AM   Specimen: Nasopharyngeal Swab  Result Value Ref Range   SARS Coronavirus 2 NEGATIVE NEGATIVE    Comment: (NOTE) SARS-CoV-2 target nucleic acids are NOT DETECTED. The SARS-CoV-2 RNA is generally detectable in upper and lower respiratory specimens during the acute phase of infection. Negative results do not preclude  SARS-CoV-2 infection, do not rule out co-infections with other pathogens, and should not be used as the sole basis for treatment or other patient management decisions. Negative results must be combined with clinical observations, patient history, and epidemiological information. The expected result is Negative. Fact Sheet for Patients: HairSlick.no Fact Sheet for Healthcare Providers: quierodirigir.com This test is not yet  approved or cleared by the [place] FDA and  has been authorized for detection and/or diagnosis of SARS-CoV-2 by FDA under an Emergency Use Authorization (EUA). This EUA will remain  in effect (meaning this test can be used) for the duration of the COVID-19 declaration under Section 56 4(b)(1) of the Act, 21 U.S.C. section 360bbb-3(b)(1), unless the authorization is terminated or revoked sooner. Performed at [facility], [address] [place], [place] [address]   Comprehensive metabolic panel     Status: Abnormal   Collection Time: 10/07/19  4:06 AM  Result Value Ref Range   Sodium 138 135 - 145 mmol/L   Potassium 3.1 (L) 3.5 - 5.1 mmol/L   Chloride 99 98 - 111 mmol/L   CO2 29 22 - 32 mmol/L   Glucose, Bld 124 (H) 70 - 99 mg/dL   BUN <5 (L) 6 - 20 mg/dL   Creatinine, Ser 2.130.70 0.44 - 1.00 mg/dL   Calcium 8.2 (L) 8.9 - 10.3 mg/dL   Total Protein 5.9 (L) 6.5 - 8.1 g/dL   Albumin 2.7 (L) 3.5 - 5.0 g/dL   AST 52 (H) 15 - 41 U/L   ALT 55 (H) 0 - 44 U/L   Alkaline Phosphatase 58 38 - 126 U/L   Total Bilirubin 1.0 0.3 - 1.2 mg/dL   GFR calc non Af Amer >60 >60 mL/min   GFR calc Af Amer >60 >60 mL/min   Anion gap 10 5 - 15    Comment: Performed at [facility], [address] [place], [place] [address]  CBC     Status: Abnormal   Collection Time: 10/07/19  4:06 AM  Result Value Ref Range   WBC 6.8 4.0 - 10.5 K/uL   RBC 2.30 (L) 3.87 - 5.11 MIL/uL   Hemoglobin 7.0 (L) 12.0 - 15.0 g/dL    HCT 84.621.2 (L) 96.236.0 - 46.0 %   MCV 92.2 80.0 - 100.0 fL   MCH 30.4 26.0 - 34.0 pg   MCHC 33.0 30.0 - 36.0 g/dL   RDW 95.212.1 84.111.5 - 32.415.5 %   Platelets 385 150 - 400 K/uL   nRBC 0.0 0.0 - 0.2 %    Comment: Performed at [facility], [address] [place], [place] [address]   DG Chest 2 View  Result Date: 10/06/2019 CLINICAL DATA:  Fever and tachycardia EXAM: CHEST - 2 VIEW COMPARISON:  04/01/2018 FINDINGS: The heart size and mediastinal contours are within normal limits. Both lungs are clear. The visualized skeletal structures are unremarkable. IMPRESSION: No active cardiopulmonary disease. Electronically Signed   By: Deatra RobinsonKevin  Herman M.D.   On: 10/06/2019 20:52   CT Angio Chest PE W and/or Wo Contrast  Result Date: 10/07/2019 CLINICAL DATA:  Abdominal distension.  Shortness of breath EXAM: CT ANGIOGRAPHY CHEST CT ABDOMEN AND PELVIS WITH CONTRAST TECHNIQUE: Multidetector CT imaging of the chest was performed using the standard protocol during bolus administration of intravenous contrast. Multiplanar CT image reconstructions and MIPs were obtained to evaluate the vascular anatomy. Multidetector CT imaging of the abdomen and pelvis was performed using the standard protocol during bolus administration of intravenous contrast. CONTRAST:  100mL OMNIPAQUE IOHEXOL 350 MG/ML SOLN COMPARISON:  None. FINDINGS: CTA CHEST FINDINGS Cardiovascular: Contrast injection is sufficient to demonstrate satisfactory opacification of the pulmonary arteries to the segmental level. There is no pulmonary embolus. The main pulmonary artery is within normal limits for size. There is no CT evidence of acute right heart strain.  The visualized aorta is normal. Heart size is normal, without pericardial effusion. Mediastinum/Nodes: --No mediastinal or hilar lymphadenopathy. --No axillary lymphadenopathy. --No supraclavicular lymphadenopathy. --the thyroid gland is enlarged without evidence for distinct thyroid nodule. --The esophagus  is unremarkable Lungs/Pleura: No pulmonary nodules or masses. No pleural effusion or pneumothorax. No focal airspace consolidation. No focal pleural abnormality. Musculoskeletal: No chest wall abnormality. No acute or significant osseous findings. Review of the MIP images confirms the above findings. CT ABDOMEN and PELVIS FINDINGS Hepatobiliary: The liver is normal. Normal gallbladder.There is no biliary ductal dilation. Pancreas: Normal contours without ductal dilatation. No peripancreatic fluid collection. Spleen: No splenic laceration or hematoma. Adrenals/Urinary Tract: --Adrenal glands: No adrenal hemorrhage. --Right kidney/ureter: No hydronephrosis or perinephric hematoma. --Left kidney/ureter: No hydronephrosis or perinephric hematoma. --Urinary bladder: Unremarkable. Stomach/Bowel: --Stomach/Duodenum: No hiatal hernia or other gastric abnormality. Normal duodenal course and caliber. --Small bowel: No dilatation or inflammation. --Colon: No focal abnormality. --Appendix: Normal. Vascular/Lymphatic: Normal course and caliber of the major abdominal vessels. --No retroperitoneal lymphadenopathy. --No mesenteric lymphadenopathy. --there are mildly enlarged inguinal lymph nodes bilaterally favored to be reactive. Reproductive: Unremarkable Other: There is diffuse body wall edema. There are pockets of subcutaneous gas involving the posterior thorax favored to be related to the patient's reported history of recent liposuction. In the anterior abdominal wall. There are fluid collections deep to the subcutaneous fat and superficial to the underlying muscular fascia. There is overlying skin thickening. Musculoskeletal. No acute displaced fractures. Review of the MIP images confirms the above findings. IMPRESSION: 1. No acute pulmonary embolism. 2. The lungs are clear. 3. No acute intra-abdominal process. 4. Sequela of finding involving the thorax and abdomen likely related to the patient's reported history recent  liposuction. There are significant subcutaneous fluid collections involving the abdominal wall which may represent postoperative seromas or hematomas. A developing abscess is not excluded on this exam. Electronically Signed   By: Katherine Mantle M.D.   On: 10/07/2019 00:50   CT ABDOMEN PELVIS W CONTRAST  Result Date: 10/07/2019 CLINICAL DATA:  Abdominal distension.  Shortness of breath EXAM: CT ANGIOGRAPHY CHEST CT ABDOMEN AND PELVIS WITH CONTRAST TECHNIQUE: Multidetector CT imaging of the chest was performed using the standard protocol during bolus administration of intravenous contrast. Multiplanar CT image reconstructions and MIPs were obtained to evaluate the vascular anatomy. Multidetector CT imaging of the abdomen and pelvis was performed using the standard protocol during bolus administration of intravenous contrast. CONTRAST:  OMNIPAQUE IOHEXOL 350 MG/ML SOLN COMPARISON:  None. FINDINGS: CTA CHEST FINDINGS Cardiovascular: Contrast injection is sufficient to demonstrate satisfactory opacification of the pulmonary arteries to the segmental level. There is no pulmonary embolus. The main pulmonary artery is within normal limits for size. There is no CT evidence of acute right heart strain. The visualized aorta is normal. Heart size is normal, without pericardial effusion. Mediastinum/Nodes: --No mediastinal or hilar lymphadenopathy. --No axillary lymphadenopathy. --No supraclavicular lymphadenopathy. --the thyroid gland is enlarged without evidence for distinct thyroid nodule. --The esophagus is unremarkable Lungs/Pleura: No pulmonary nodules or masses. No pleural effusion or pneumothorax. No focal airspace consolidation. No focal pleural abnormality. Musculoskeletal: No chest wall abnormality. No acute or significant osseous findings. Review of the MIP images confirms the above findings. CT ABDOMEN and PELVIS FINDINGS Hepatobiliary: The liver is normal. Normal gallbladder.There is no biliary ductal  dilation. Pancreas: Normal contours without ductal dilatation. No peripancreatic fluid collection. Spleen: No splenic laceration or hematoma. Adrenals/Urinary Tract: --Adrenal glands: No adrenal hemorrhage. --Right kidney/ureter: No hydronephrosis or perinephric  hematoma. --Left kidney/ureter: No hydronephrosis or perinephric hematoma. --Urinary bladder: Unremarkable. Stomach/Bowel: --Stomach/Duodenum: No hiatal hernia or other gastric abnormality. Normal duodenal course and caliber. --Small bowel: No dilatation or inflammation. --Colon: No focal abnormality. --Appendix: Normal. Vascular/Lymphatic: Normal course and caliber of the major abdominal vessels. --No retroperitoneal lymphadenopathy. --No mesenteric lymphadenopathy. --there are mildly enlarged inguinal lymph nodes bilaterally favored to be reactive. Reproductive: Unremarkable Other: There is diffuse body wall edema. There are pockets of subcutaneous gas involving the posterior thorax favored to be related to the patient's reported history of recent liposuction. In the anterior abdominal wall. There are fluid collections deep to the subcutaneous fat and superficial to the underlying muscular fascia. There is overlying skin thickening. Musculoskeletal. No acute displaced fractures. Review of the MIP images confirms the above findings. IMPRESSION: 1. No acute pulmonary embolism. 2. The lungs are clear. 3. No acute intra-abdominal process. 4. Sequela of finding involving the thorax and abdomen likely related to the patient's reported history recent liposuction. There are significant subcutaneous fluid collections involving the abdominal wall which may represent postoperative seromas or hematomas. A developing abscess is not excluded on this exam. Electronically Signed   By: Katherine Mantle M.D.   On: 10/07/2019 00:50   Anti-infectives (From admission, onward)   None      Assessment/Plan HTN  ABL Anemia - Hgb 7.0 - Per [facility] -   Hx of liposuction  and fat transportation into her buttocks in [place] on 1/13 by Dr. Thana Ates Subcutaneous fluid collections involving the abdominal wall - Patient is afebrile with WBC of 6.8 - From a general surgery standpoint. No indication for a procedure at this time. Would recommend consultation or follow up with plastic surgery. We will sign off.   Jacinto Halim, [facility] Surgery 10/07/2019, 9:44 AM Please see Amion for pager number during day hours 7:00am-4:30pm

## 2019-10-07 NOTE — H&P (Signed)
TRH H&P    Patient Demographics:    Isabel Cole, is a 34 y.o. female  MRN: [id number]  DOB - Jun 06, 1986  Admit Date - 10/06/2019  Referring MD/NP/PA:  Roxy Horseman  Outpatient Primary MD for the patient is Patient, No Pcp Per  Patient coming from:  home  Chief complaint- anemia, abdominal swelling   HPI:    Isabel Cole  is a 34 y.o. female,  w hypertension, recently had lipsuction about 1 week ago, with fat transplant into buttock as well as cell saver procedure.  Pt states had had burning over the abdomen as well as some swelling, and therefore presented to ED.   In ED,  T 98.5, P 129, R 18, Bp 144/80  Pox 100% on RA Wt 80.7kg  CTA chest abd/ pelvis IMPRESSION: 1. No acute pulmonary embolism. 2. The lungs are clear. 3. No acute intra-abdominal process. 4. Sequela of finding involving the thorax and abdomen likely related to the patient's reported history recent liposuction. There are significant subcutaneous fluid collections involving the abdominal wall which may represent postoperative seromas or hematomas. A developing abscess is not excluded on this exam.  ED consulted surgery regarding ? Postoperative seroomas vs hematoma.  Per ED they will follow.   Wbc 8.4, Hgb 8.0, Plt 398 Na 137, K 3.4, Bun <5, Creatinine 0.82 Ast 56, Alt 57  Lactic acid 1.4  Pt will be admitted for anemia and ? Postoperative seroma   Review of systems:    In addition to the HPI above,  No Fever-chills, No Headache, No changes with Vision or hearing, No problems swallowing food or Liquids, No Chest pain, Cough or Shortness of Breath,   No Nausea or Vomiting, bowel movements are regular, No Blood in stool or Urine, No dysuria, No new skin rashes or bruises, No new joints pains-aches,  No new weakness, tingling, numbness in any extremity, No recent weight gain or loss, No polyuria,  polydypsia or polyphagia, No significant Mental Stressors.  All other systems reviewed and are negative.    Past History of the following :    Past Medical History:  Diagnosis Date  . Gestational hypertension   . Hypertension       Past Surgical History:  Procedure Laterality Date  . CESAREAN SECTION    . LIPOSUCTION        Social History:      Social History   Tobacco Use  . Smoking status: Never Smoker  . Smokeless tobacco: Never Used  Substance Use Topics  . Alcohol use: No       Family History :     Family History  Problem Relation Age of Onset  . Cancer Mother        Home Medications:   Prior to Admission medications   Medication Sig Start Date End Date Taking? Authorizing Provider  enoxaparin (LOVENOX) 40 MG/0.4ML injection Inject 40 mg into the skin daily.  09/30/19  Yes [provider]  ondansetron (ZOFRAN) 8 MG tablet Take 8 mg by mouth every 6 (six)  hours as needed for nausea or vomiting.  09/30/19  Yes [provider]  oxyCODONE-acetaminophen (PERCOCET) 10-325 MG tablet Take 1 tablet by mouth every 4 (four) hours as needed. 09/30/19  Yes [provider]     Allergies:    No Known Allergies   Physical Exam:   Vitals  Blood pressure 136/79, pulse (!) 105, temperature 98.7 F (37.1 C), temperature source Oral, resp. rate 19, weight 80.7 kg, SpO2 99 %.  1.  General: axoxo3  2. Psychiatric: euthymic  3. Neurologic: Nonfocal, cn2-12 intact, reflexes 2+ symmetric, diffuse with no clonus, motor 5/5 in all 4 ext  4. HEENMT:  Anicteric, pupils 1.60mm symmetric, direct, consensual intact Neck: no jvd  5. Respiratory : CTAB  6. Cardiovascular : rrr s1, s2, no m/g/r  7. Gastrointestinal:  Abd: soft, nt, nd,+bs  8. Skin:  Ext: no c/c/e  9.Musculoskeletal:  Good ROM      Data Review:    CBC Recent Labs  Lab 10/06/19 2017  WBC 8.4  HGB 8.0*  HCT 23.8*  PLT 398  MCV 91.5  MCH 30.8  MCHC 33.6    RDW 11.9  LYMPHSABS 1.6  MONOABS 0.4  EOSABS 0.3  BASOSABS 0.0   ------------------------------------------------------------------------------------------------------------------  Results for orders placed or performed during the hospital encounter of 10/06/19 (from the past 48 hour(s))  Urinalysis, Routine w reflex microscopic     Status: Abnormal   Collection Time: 10/06/19  8:02 PM  Result Value Ref Range   Color, Urine YELLOW YELLOW   APPearance HAZY (A) CLEAR   Specific Gravity, Urine 1.006 1.005 - 1.030   pH 8.0 5.0 - 8.0   Glucose, UA NEGATIVE NEGATIVE mg/dL   Hgb urine dipstick NEGATIVE NEGATIVE   Bilirubin Urine NEGATIVE NEGATIVE   Ketones, ur NEGATIVE NEGATIVE mg/dL   Protein, ur NEGATIVE NEGATIVE mg/dL   Nitrite NEGATIVE NEGATIVE   Leukocytes,Ua TRACE (A) NEGATIVE   RBC / HPF 0-5 0 - 5 RBC/hpf   WBC, UA 0-5 0 - 5 WBC/hpf   Bacteria, UA NONE SEEN NONE SEEN   Squamous Epithelial / LPF 0-5 0 - 5    Comment: Performed at [facility], [address], [place], [place] [address]  Comprehensive metabolic panel     Status: Abnormal   Collection Time: 10/06/19  8:17 PM  Result Value Ref Range   Sodium 137 135 - 145 mmol/L   Potassium 3.4 (L) 3.5 - 5.1 mmol/L   Chloride 99 98 - 111 mmol/L   CO2 29 22 - 32 mmol/L   Glucose, Bld 96 70 - 99 mg/dL   BUN <5 (L) 6 - 20 mg/dL   Creatinine, Ser 0.82 0.44 - 1.00 mg/dL   Calcium 8.5 (L) 8.9 - 10.3 mg/dL   Total Protein 6.2 (L) 6.5 - 8.1 g/dL   Albumin 3.0 (L) 3.5 - 5.0 g/dL   AST 56 (H) 15 - 41 U/L   ALT 57 (H) 0 - 44 U/L   Alkaline Phosphatase 63 38 - 126 U/L   Total Bilirubin 0.8 0.3 - 1.2 mg/dL   GFR calc non Af Amer >60 >60 mL/min   GFR calc Af Amer >60 >60 mL/min   Anion gap 9 5 - 15    Comment: Performed at [facility], [place] [address], [place], [place] [address]  Lactic acid, plasma     Status: None   Collection Time: 10/06/19  8:17 PM  Result Value Ref Range   Lactic  Acid, Venous 1.4 0.5 - 1.9 mmol/L     Comment: Performed at [facility], [address] [place], [place] [address]  CBC with Differential     Status: Abnormal   Collection Time: 10/06/19  8:17 PM  Result Value Ref Range   WBC 8.4 4.0 - 10.5 K/uL   RBC 2.60 (L) 3.87 - 5.11 MIL/uL   Hemoglobin 8.0 (L) 12.0 - 15.0 g/dL   HCT 09.823.8 (L) 11.936.0 - 14.746.0 %   MCV 91.5 80.0 - 100.0 fL   MCH 30.8 26.0 - 34.0 pg   MCHC 33.6 30.0 - 36.0 g/dL   RDW 82.911.9 56.211.5 - 13.015.5 %   Platelets 398 150 - 400 K/uL   nRBC 0.0 0.0 - 0.2 %   Neutrophils Relative % 71 %   Neutro Abs 6.0 1.7 - 7.7 K/uL   Lymphocytes Relative 19 %   Lymphs Abs 1.6 0.7 - 4.0 K/uL   Monocytes Relative 5 %   Monocytes Absolute 0.4 0.1 - 1.0 K/uL   Eosinophils Relative 3 %   Eosinophils Absolute 0.3 0.0 - 0.5 K/uL   Basophils Relative 1 %   Basophils Absolute 0.0 0.0 - 0.1 K/uL   Immature Granulocytes 1 %   Abs Immature Granulocytes 0.08 (H) 0.00 - 0.07 K/uL    Comment: Performed at [facility], [address] [place], [place] [address]  Protime-INR     Status: None   Collection Time: 10/06/19  8:17 PM  Result Value Ref Range   Prothrombin Time 12.6 11.4 - 15.2 seconds   INR 1.0 0.8 - 1.2    Comment: (NOTE) INR goal varies based on device and disease states. Performed at [facility], [address] [place], [place] [address]   I-Stat beta hCG blood, ED     Status: None   Collection Time: 10/06/19  8:31 PM  Result Value Ref Range   I-stat hCG, quantitative <5.0 <5 mIU/mL   Comment 3            Comment:   GEST. AGE      CONC.  (mIU/mL)   <=1 WEEK        5 - 50     2 WEEKS       50 - 500     3 WEEKS       100 - 10,000     4 WEEKS     1,000 - 30,000        FEMALE AND NON-PREGNANT FEMALE:     LESS THAN 5 mIU/mL   POC occult blood, ED Provider will collect     Status: None   Collection Time: 10/06/19 10:46 PM  Result Value Ref Range   Fecal Occult Bld NEGATIVE NEGATIVE  Type and screen     Status: None   Collection Time: 10/06/19 11:25 PM   Result Value Ref Range   ABO/RH(D) B POS    Antibody Screen NEG    Sample Expiration      10/09/2019,2359 Performed at [facility], [address] [place], [place] [address]   ABO/Rh     Status: None   Collection Time: 10/06/19 11:25 PM  Result Value Ref Range   ABO/RH(D)      B POS Performed at [facility], [address] [place], [place] [address]   Lactic acid, plasma     Status: None   Collection Time: 10/06/19 11:33 PM  Result Value  Ref Range   Lactic Acid, Venous 1.6 0.5 - 1.9 mmol/L    Comment: Performed at [facility], [address] [place], [place] [address]    Chemistries  Recent Labs  Lab 10/06/19 2017  NA 137  K 3.4*  CL 99  CO2 29  GLUCOSE 96  BUN <5*  CREATININE 0.82  CALCIUM 8.5*  AST 56*  ALT 57*  ALKPHOS 63  BILITOT 0.8   ------------------------------------------------------------------------------------------------------------------  ------------------------------------------------------------------------------------------------------------------ GFR: CrCl cannot be calculated (Unknown ideal weight.). Liver Function Tests: Recent Labs  Lab 10/06/19 2017  AST 56*  ALT 57*  ALKPHOS 63  BILITOT 0.8  PROT 6.2*  ALBUMIN 3.0*   No results for input(s): LIPASE, AMYLASE in the last 168 hours. No results for input(s): AMMONIA in the last 168 hours. Coagulation Profile: Recent Labs  Lab 10/06/19 2017  INR 1.0   Cardiac Enzymes: No results for input(s): CKTOTAL, CKMB, CKMBINDEX, TROPONINI in the last 168 hours. BNP (last 3 results) No results for input(s): PROBNP in the last 8760 hours. HbA1C: No results for input(s): HGBA1C in the last 72 hours. CBG: No results for input(s): GLUCAP in the last 168 hours. Lipid Profile: No results for input(s): CHOL, HDL, LDLCALC, TRIG, CHOLHDL, LDLDIRECT in the last 72 hours. Thyroid Function Tests: No results for input(s): TSH, T4TOTAL, FREET4, T3FREE, THYROIDAB in the last  72 hours. Anemia Panel: No results for input(s): VITAMINB12, FOLATE, FERRITIN, TIBC, IRON, RETICCTPCT in the last 72 hours.  --------------------------------------------------------------------------------------------------------------- Urine analysis:    Component Value Date/Time   COLORURINE YELLOW 10/06/2019 2002   APPEARANCEUR HAZY (A) 10/06/2019 2002   LABSPEC 1.006 10/06/2019 2002   PHURINE 8.0 10/06/2019 2002   GLUCOSEU NEGATIVE 10/06/2019 2002   HGBUR NEGATIVE 10/06/2019 2002   BILIRUBINUR NEGATIVE 10/06/2019 2002   KETONESUR NEGATIVE 10/06/2019 2002   PROTEINUR NEGATIVE 10/06/2019 2002   UROBILINOGEN 0.2 08/27/2014 1707   NITRITE NEGATIVE 10/06/2019 2002   LEUKOCYTESUR TRACE (A) 10/06/2019 2002      Imaging Results:    DG Chest 2 View  Result Date: 10/06/2019 CLINICAL DATA:  Fever and tachycardia EXAM: CHEST - 2 VIEW COMPARISON:  04/01/2018 FINDINGS: The heart size and mediastinal contours are within normal limits. Both lungs are clear. The visualized skeletal structures are unremarkable. IMPRESSION: No active cardiopulmonary disease. Electronically Signed   By: Deatra RobinsonKevin  Herman M.D.   On: 10/06/2019 20:52   CT Angio Chest PE W and/or Wo Contrast  Result Date: 10/07/2019 CLINICAL DATA:  Abdominal distension.  Shortness of breath EXAM: CT ANGIOGRAPHY CHEST CT ABDOMEN AND PELVIS WITH CONTRAST TECHNIQUE: Multidetector CT imaging of the chest was performed using the standard protocol during bolus administration of intravenous contrast. Multiplanar CT image reconstructions and MIPs were obtained to evaluate the vascular anatomy. Multidetector CT imaging of the abdomen and pelvis was performed using the standard protocol during bolus administration of intravenous contrast. CONTRAST:  100mL OMNIPAQUE IOHEXOL 350 MG/ML SOLN COMPARISON:  None. FINDINGS: CTA CHEST FINDINGS Cardiovascular: Contrast injection is sufficient to demonstrate satisfactory opacification of the pulmonary arteries  to the segmental level. There is no pulmonary embolus. The main pulmonary artery is within normal limits for size. There is no CT evidence of acute right heart strain. The visualized aorta is normal. Heart size is normal, without pericardial effusion. Mediastinum/Nodes: --No mediastinal or hilar lymphadenopathy. --No axillary lymphadenopathy. --No supraclavicular lymphadenopathy. --the thyroid gland is enlarged without evidence for distinct thyroid nodule. --The esophagus is unremarkable Lungs/Pleura: No pulmonary nodules or masses. No  pleural effusion or pneumothorax. No focal airspace consolidation. No focal pleural abnormality. Musculoskeletal: No chest wall abnormality. No acute or significant osseous findings. Review of the MIP images confirms the above findings. CT ABDOMEN and PELVIS FINDINGS Hepatobiliary: The liver is normal. Normal gallbladder.There is no biliary ductal dilation. Pancreas: Normal contours without ductal dilatation. No peripancreatic fluid collection. Spleen: No splenic laceration or hematoma. Adrenals/Urinary Tract: --Adrenal glands: No adrenal hemorrhage. --Right kidney/ureter: No hydronephrosis or perinephric hematoma. --Left kidney/ureter: No hydronephrosis or perinephric hematoma. --Urinary bladder: Unremarkable. Stomach/Bowel: --Stomach/Duodenum: No hiatal hernia or other gastric abnormality. Normal duodenal course and caliber. --Small bowel: No dilatation or inflammation. --Colon: No focal abnormality. --Appendix: Normal. Vascular/Lymphatic: Normal course and caliber of the major abdominal vessels. --No retroperitoneal lymphadenopathy. --No mesenteric lymphadenopathy. --there are mildly enlarged inguinal lymph nodes bilaterally favored to be reactive. Reproductive: Unremarkable Other: There is diffuse body wall edema. There are pockets of subcutaneous gas involving the posterior thorax favored to be related to the patient's reported history of recent liposuction. In the anterior  abdominal wall. There are fluid collections deep to the subcutaneous fat and superficial to the underlying muscular fascia. There is overlying skin thickening. Musculoskeletal. No acute displaced fractures. Review of the MIP images confirms the above findings. IMPRESSION: 1. No acute pulmonary embolism. 2. The lungs are clear. 3. No acute intra-abdominal process. 4. Sequela of finding involving the thorax and abdomen likely related to the patient's reported history recent liposuction. There are significant subcutaneous fluid collections involving the abdominal wall which may represent postoperative seromas or hematomas. A developing abscess is not excluded on this exam. Electronically Signed   By: Katherine Mantle M.D.   On: 10/07/2019 00:50   CT ABDOMEN PELVIS W CONTRAST  Result Date: 10/07/2019 CLINICAL DATA:  Abdominal distension.  Shortness of breath EXAM: CT ANGIOGRAPHY CHEST CT ABDOMEN AND PELVIS WITH CONTRAST TECHNIQUE: Multidetector CT imaging of the chest was performed using the standard protocol during bolus administration of intravenous contrast. Multiplanar CT image reconstructions and MIPs were obtained to evaluate the vascular anatomy. Multidetector CT imaging of the abdomen and pelvis was performed using the standard protocol during bolus administration of intravenous contrast. CONTRAST:  OMNIPAQUE IOHEXOL 350 MG/ML SOLN COMPARISON:  None. FINDINGS: CTA CHEST FINDINGS Cardiovascular: Contrast injection is sufficient to demonstrate satisfactory opacification of the pulmonary arteries to the segmental level. There is no pulmonary embolus. The main pulmonary artery is within normal limits for size. There is no CT evidence of acute right heart strain. The visualized aorta is normal. Heart size is normal, without pericardial effusion. Mediastinum/Nodes: --No mediastinal or hilar lymphadenopathy. --No axillary lymphadenopathy. --No supraclavicular lymphadenopathy. --the thyroid gland is enlarged  without evidence for distinct thyroid nodule. --The esophagus is unremarkable Lungs/Pleura: No pulmonary nodules or masses. No pleural effusion or pneumothorax. No focal airspace consolidation. No focal pleural abnormality. Musculoskeletal: No chest wall abnormality. No acute or significant osseous findings. Review of the MIP images confirms the above findings. CT ABDOMEN and PELVIS FINDINGS Hepatobiliary: The liver is normal. Normal gallbladder.There is no biliary ductal dilation. Pancreas: Normal contours without ductal dilatation. No peripancreatic fluid collection. Spleen: No splenic laceration or hematoma. Adrenals/Urinary Tract: --Adrenal glands: No adrenal hemorrhage. --Right kidney/ureter: No hydronephrosis or perinephric hematoma. --Left kidney/ureter: No hydronephrosis or perinephric hematoma. --Urinary bladder: Unremarkable. Stomach/Bowel: --Stomach/Duodenum: No hiatal hernia or other gastric abnormality. Normal duodenal course and caliber. --Small bowel: No dilatation or inflammation. --Colon: No focal abnormality. --Appendix: Normal. Vascular/Lymphatic: Normal course and caliber of the major abdominal vessels. --  No retroperitoneal lymphadenopathy. --No mesenteric lymphadenopathy. --there are mildly enlarged inguinal lymph nodes bilaterally favored to be reactive. Reproductive: Unremarkable Other: There is diffuse body wall edema. There are pockets of subcutaneous gas involving the posterior thorax favored to be related to the patient's reported history of recent liposuction. In the anterior abdominal wall. There are fluid collections deep to the subcutaneous fat and superficial to the underlying muscular fascia. There is overlying skin thickening. Musculoskeletal. No acute displaced fractures. Review of the MIP images confirms the above findings. IMPRESSION: 1. No acute pulmonary embolism. 2. The lungs are clear. 3. No acute intra-abdominal process. 4. Sequela of finding involving the thorax and abdomen  likely related to the patient's reported history recent liposuction. There are significant subcutaneous fluid collections involving the abdominal wall which may represent postoperative seromas or hematomas. A developing abscess is not excluded on this exam. Electronically Signed   By: Katherine Mantle M.D.   On: 10/07/2019 00:50       Assessment & Plan:    Principal Problem:   Anemia  Abdominal pain "burning" Surgery consulted by ED, appreciate input Will try gabapentin 300mg  po bid  Anemia Check cbc in am  Abnormal liver function Check acute hepatitis panel Check cmp in am  Dispo: if her hgb stable and per surgery stable, then DC to home.   DVT Prophylaxis-   SCDs   AM Labs Ordered, also please review Full Orders  Family Communication: Admission, patients condition and plan of care including tests being ordered have been discussed with the patient  who indicate understanding and agree with the plan and Code Status.  Code Status: Full code per patient, attempted to notify mother that patient admitted to [facility], no response  Admission status: Observationt: Based on patients clinical presentation and evaluation of above clinical data, I have made determination that patient meets observation criteria at the time.  Time spent in minutes : 55 minutes   [facility] M.D on 10/07/2019 at 2:37 AM

## 2019-10-07 NOTE — ED Notes (Signed)
ED TO INPATIENT HANDOFF REPORT  ED Nurse Name and Phone #: [phone] Wendie Simmer., RN  S Name/Age/Gender Cherene Julian 34 y.o. female Room/Bed: 024C/024C  Code Status   Code Status: Full Code  Home/SNF/Other Home Patient oriented to: self, place, time and situation Is this baseline? Yes   Triage Complete: Triage complete  Chief Complaint Anemia [D64.9]  Triage Note Pt reports that last week she went to [place] for a liposuction surgery, went for a follow up and had a fever and tachycardia and SOB, was told to increase fluid, now pt is fluid and swelling from the abdomen down. Weight gain of 10lbs since yesterday     Allergies No Known Allergies  Level of Care/Admitting Diagnosis ED Disposition    ED Disposition Condition Comment   Admit  Hospital Area: [facility] [[id number]]  Level of Care: Telemetry Medical [104]  I expect the patient will be discharged within 24 hours: Yes  LOW acuity---Tx typically complete <24 hrs---ACUTE conditions typically can be evaluated <24 hours---LABS likely to return to acceptable levels <24 hours---IS near functional baseline---EXPECTED to return to current living arrangement---NOT newly hypoxic: Meets criteria for 5C-Observation unit  Covid Evaluation: Asymptomatic Screening Protocol (No Symptoms)  Diagnosis: Anemia [048889]  Admitting Physician: Pearson Grippe [[id number]]  Attending Physician: Pearson Grippe [[id number]]       B Medical/Surgery History Past Medical History:  Diagnosis Date  . Gestational hypertension   . Hypertension    Past Surgical History:  Procedure Laterality Date  . CESAREAN SECTION    . LIPOSUCTION       A IV Location/Drains/Wounds Patient Lines/Drains/Airways Status   Active Line/Drains/Airways    Name:   Placement date:   Placement time:   Site:   Days:   Peripheral IV 10/06/19 Left Antecubital   10/06/19    2320    Antecubital   1          Intake/Output Last 24 hours No intake or output data  in the 24 hours ending 10/07/19 0459  Labs/Imaging Results for orders placed or performed during the hospital encounter of 10/06/19 (from the past 48 hour(s))  Urinalysis, Routine w reflex microscopic     Status: Abnormal   Collection Time: 10/06/19  8:02 PM  Result Value Ref Range   Color, Urine YELLOW YELLOW   APPearance HAZY (A) CLEAR   Specific Gravity, Urine 1.006 1.005 - 1.030   pH 8.0 5.0 - 8.0   Glucose, UA NEGATIVE NEGATIVE mg/dL   Hgb urine dipstick NEGATIVE NEGATIVE   Bilirubin Urine NEGATIVE NEGATIVE   Ketones, ur NEGATIVE NEGATIVE mg/dL   Protein, ur NEGATIVE NEGATIVE mg/dL   Nitrite NEGATIVE NEGATIVE   Leukocytes,Ua TRACE (A) NEGATIVE   RBC / HPF 0-5 0 - 5 RBC/hpf   WBC, UA 0-5 0 - 5 WBC/hpf   Bacteria, UA NONE SEEN NONE SEEN   Squamous Epithelial / LPF 0-5 0 - 5    Comment: Performed at [facility], [address], [place], [place] [address]  Comprehensive metabolic panel     Status: Abnormal   Collection Time: 10/06/19  8:17 PM  Result Value Ref Range   Sodium 137 135 - 145 mmol/L   Potassium 3.4 (L) 3.5 - 5.1 mmol/L   Chloride 99 98 - 111 mmol/L   CO2 29 22 - 32 mmol/L   Glucose, Bld 96 70 - 99 mg/dL   BUN <5 (L) 6 - 20 mg/dL   Creatinine, Ser 0.38 0.44 -  1.00 mg/dL   Calcium 8.5 (L) 8.9 - 10.3 mg/dL   Total Protein 6.2 (L) 6.5 - 8.1 g/dL   Albumin 3.0 (L) 3.5 - 5.0 g/dL   AST 56 (H) 15 - 41 U/L   ALT 57 (H) 0 - 44 U/L   Alkaline Phosphatase 63 38 - 126 U/L   Total Bilirubin 0.8 0.3 - 1.2 mg/dL   GFR calc non Af Amer >60 >60 mL/min   GFR calc Af Amer >60 >60 mL/min   Anion gap 9 5 - 15    Comment: Performed at [facility], [address], [place], [place] [address]  Lactic acid, plasma     Status: None   Collection Time: 10/06/19  8:17 PM  Result Value Ref Range   Lactic Acid, Venous 1.4 0.5 - 1.9 mmol/L    Comment: Performed at [facility], [address], [place], [place] [address]  CBC with Differential     Status: Abnormal    Collection Time: 10/06/19  8:17 PM  Result Value Ref Range   WBC 8.4 4.0 - 10.5 K/uL   RBC 2.60 (L) 3.87 - 5.11 MIL/uL   Hemoglobin 8.0 (L) 12.0 - 15.0 g/dL   HCT 39.0 (L) 30.0 - 92.3 %   MCV 91.5 80.0 - 100.0 fL   MCH 30.8 26.0 - 34.0 pg   MCHC 33.6 30.0 - 36.0 g/dL   RDW 30.0 76.2 - 26.3 %   Platelets 398 150 - 400 K/uL   nRBC 0.0 0.0 - 0.2 %   Neutrophils Relative % 71 %   Neutro Abs 6.0 1.7 - 7.7 K/uL   Lymphocytes Relative 19 %   Lymphs Abs 1.6 0.7 - 4.0 K/uL   Monocytes Relative 5 %   Monocytes Absolute 0.4 0.1 - 1.0 K/uL   Eosinophils Relative 3 %   Eosinophils Absolute 0.3 0.0 - 0.5 K/uL   Basophils Relative 1 %   Basophils Absolute 0.0 0.0 - 0.1 K/uL   Immature Granulocytes 1 %   Abs Immature Granulocytes 0.08 (H) 0.00 - 0.07 K/uL    Comment: Performed at [facility], [address] [place], [place] [address]  Protime-INR     Status: None   Collection Time: 10/06/19  8:17 PM  Result Value Ref Range   Prothrombin Time 12.6 11.4 - 15.2 seconds   INR 1.0 0.8 - 1.2    Comment: (NOTE) INR goal varies based on device and disease states. Performed at [facility], [address] [place], [place] [address]   I-Stat beta hCG blood, ED     Status: None   Collection Time: 10/06/19  8:31 PM  Result Value Ref Range   I-stat hCG, quantitative <5.0 <5 mIU/mL   Comment 3            Comment:   GEST. AGE      CONC.  (mIU/mL)   <=1 WEEK        5 - 50     2 WEEKS       50 - 500     3 WEEKS       100 - 10,000     4 WEEKS     1,000 - 30,000        FEMALE AND NON-PREGNANT FEMALE:     LESS THAN 5 mIU/mL   POC occult blood, ED Provider will collect     Status: None   Collection Time: 10/06/19 10:46  PM  Result Value Ref Range   Fecal Occult Bld NEGATIVE NEGATIVE  Type and screen     Status: None   Collection Time: 10/06/19 11:25 PM  Result Value Ref Range   ABO/RH(D) B POS    Antibody Screen NEG    Sample Expiration      10/09/2019,2359 Performed at [facility], [place] [address], [place], [place] [address]   ABO/Rh     Status: None   Collection Time: 10/06/19 11:25 PM  Result Value Ref Range   ABO/RH(D)      B POS Performed at [facility] [address], [place], [place] [address]   Lactic acid, plasma     Status: None   Collection Time: 10/06/19 11:33 PM  Result Value Ref Range   Lactic Acid, Venous 1.6 0.5 - 1.9 mmol/L    Comment: Performed at [facility] [address], [place], [place] [address]  SARS CORONAVIRUS 2 (TAT 6-24 HRS) Nasopharyngeal Nasopharyngeal Swab     Status: None   Collection Time: 10/07/19 12:10 AM   Specimen: Nasopharyngeal Swab  Result Value Ref Range   SARS Coronavirus 2 NEGATIVE NEGATIVE    Comment: (NOTE) SARS-CoV-2 target nucleic acids are NOT DETECTED. The SARS-CoV-2 RNA is generally detectable in upper and lower respiratory specimens during the acute phase of infection. Negative results do not preclude SARS-CoV-2 infection, do not rule out co-infections with other pathogens, and should not be used as the sole basis for treatment or other patient management decisions. Negative results must be combined with clinical observations, patient history, and epidemiological information. The expected result is Negative. Fact Sheet for Patients: SugarRoll.be Fact Sheet for Healthcare Providers: https://www.woods-mathews.com/ This test is not yet approved or cleared by the [place] FDA and  has been authorized for detection and/or diagnosis of SARS-CoV-2 by FDA under an Emergency Use Authorization (EUA). This EUA will remain  in effect (meaning this test can be used) for the duration of the COVID-19 declaration under Section 56 4(b)(1) of the Act, 21 U.S.C. section 360bbb-3(b)(1), unless the authorization is terminated or revoked sooner. Performed at [facility], [place] [address], [place], [place] [address]   CBC     Status: Abnormal    Collection Time: 10/07/19  4:06 AM  Result Value Ref Range   WBC 6.8 4.0 - 10.5 K/uL   RBC 2.30 (L) 3.87 - 5.11 MIL/uL   Hemoglobin 7.0 (L) 12.0 - 15.0 g/dL   HCT 21.2 (L) 36.0 - 46.0 %   MCV 92.2 80.0 - 100.0 fL   MCH 30.4 26.0 - 34.0 pg   MCHC 33.0 30.0 - 36.0 g/dL   RDW 12.1 11.5 - 15.5 %   Platelets 385 150 - 400 K/uL   nRBC 0.0 0.0 - 0.2 %    Comment: Performed at [facility], [place] [address], [place], [place] [address]   DG Chest 2 View  Result Date: 10/06/2019 CLINICAL DATA:  Fever and tachycardia EXAM: CHEST - 2 VIEW COMPARISON:  04/01/2018 FINDINGS: The heart size and mediastinal contours are within normal limits. Both lungs are clear. The visualized skeletal structures are unremarkable. IMPRESSION: No active cardiopulmonary disease. Electronically Signed   By: Ulyses Jarred M.D.   On: 10/06/2019 20:52   CT Angio Chest PE W and/or Wo Contrast  Result Date: 10/07/2019 CLINICAL DATA:  Abdominal distension.  Shortness of breath EXAM: CT ANGIOGRAPHY CHEST CT ABDOMEN AND PELVIS WITH CONTRAST TECHNIQUE:  Multidetector CT imaging of the chest was performed using the standard protocol during bolus administration of intravenous contrast. Multiplanar CT image reconstructions and MIPs were obtained to evaluate the vascular anatomy. Multidetector CT imaging of the abdomen and pelvis was performed using the standard protocol during bolus administration of intravenous contrast. CONTRAST:  OMNIPAQUE IOHEXOL 350 MG/ML SOLN COMPARISON:  None. FINDINGS: CTA CHEST FINDINGS Cardiovascular: Contrast injection is sufficient to demonstrate satisfactory opacification of the pulmonary arteries to the segmental level. There is no pulmonary embolus. The main pulmonary artery is within normal limits for size. There is no CT evidence of acute right heart strain. The visualized aorta is normal. Heart size is normal, without pericardial effusion. Mediastinum/Nodes: --No mediastinal or hilar lymphadenopathy.  --No axillary lymphadenopathy. --No supraclavicular lymphadenopathy. --the thyroid gland is enlarged without evidence for distinct thyroid nodule. --The esophagus is unremarkable Lungs/Pleura: No pulmonary nodules or masses. No pleural effusion or pneumothorax. No focal airspace consolidation. No focal pleural abnormality. Musculoskeletal: No chest wall abnormality. No acute or significant osseous findings. Review of the MIP images confirms the above findings. CT ABDOMEN and PELVIS FINDINGS Hepatobiliary: The liver is normal. Normal gallbladder.There is no biliary ductal dilation. Pancreas: Normal contours without ductal dilatation. No peripancreatic fluid collection. Spleen: No splenic laceration or hematoma. Adrenals/Urinary Tract: --Adrenal glands: No adrenal hemorrhage. --Right kidney/ureter: No hydronephrosis or perinephric hematoma. --Left kidney/ureter: No hydronephrosis or perinephric hematoma. --Urinary bladder: Unremarkable. Stomach/Bowel: --Stomach/Duodenum: No hiatal hernia or other gastric abnormality. Normal duodenal course and caliber. --Small bowel: No dilatation or inflammation. --Colon: No focal abnormality. --Appendix: Normal. Vascular/Lymphatic: Normal course and caliber of the major abdominal vessels. --No retroperitoneal lymphadenopathy. --No mesenteric lymphadenopathy. --there are mildly enlarged inguinal lymph nodes bilaterally favored to be reactive. Reproductive: Unremarkable Other: There is diffuse body wall edema. There are pockets of subcutaneous gas involving the posterior thorax favored to be related to the patient's reported history of recent liposuction. In the anterior abdominal wall. There are fluid collections deep to the subcutaneous fat and superficial to the underlying muscular fascia. There is overlying skin thickening. Musculoskeletal. No acute displaced fractures. Review of the MIP images confirms the above findings. IMPRESSION: 1. No acute pulmonary embolism. 2. The lungs  are clear. 3. No acute intra-abdominal process. 4. Sequela of finding involving the thorax and abdomen likely related to the patient's reported history recent liposuction. There are significant subcutaneous fluid collections involving the abdominal wall which may represent postoperative seromas or hematomas. A developing abscess is not excluded on this exam. Electronically Signed   By: Katherine Mantle M.D.   On: 10/07/2019 00:50   CT ABDOMEN PELVIS W CONTRAST  Result Date: 10/07/2019 CLINICAL DATA:  Abdominal distension.  Shortness of breath EXAM: CT ANGIOGRAPHY CHEST CT ABDOMEN AND PELVIS WITH CONTRAST TECHNIQUE: Multidetector CT imaging of the chest was performed using the standard protocol during bolus administration of intravenous contrast. Multiplanar CT image reconstructions and MIPs were obtained to evaluate the vascular anatomy. Multidetector CT imaging of the abdomen and pelvis was performed using the standard protocol during bolus administration of intravenous contrast. CONTRAST:  OMNIPAQUE IOHEXOL 350 MG/ML SOLN COMPARISON:  None. FINDINGS: CTA CHEST FINDINGS Cardiovascular: Contrast injection is sufficient to demonstrate satisfactory opacification of the pulmonary arteries to the segmental level. There is no pulmonary embolus. The main pulmonary artery is within normal limits for size. There is no CT evidence of acute right heart strain. The visualized aorta is normal. Heart size is normal, without pericardial effusion. Mediastinum/Nodes: --No mediastinal or hilar  lymphadenopathy. --No axillary lymphadenopathy. --No supraclavicular lymphadenopathy. --the thyroid gland is enlarged without evidence for distinct thyroid nodule. --The esophagus is unremarkable Lungs/Pleura: No pulmonary nodules or masses. No pleural effusion or pneumothorax. No focal airspace consolidation. No focal pleural abnormality. Musculoskeletal: No chest wall abnormality. No acute or significant osseous findings. Review  of the MIP images confirms the above findings. CT ABDOMEN and PELVIS FINDINGS Hepatobiliary: The liver is normal. Normal gallbladder.There is no biliary ductal dilation. Pancreas: Normal contours without ductal dilatation. No peripancreatic fluid collection. Spleen: No splenic laceration or hematoma. Adrenals/Urinary Tract: --Adrenal glands: No adrenal hemorrhage. --Right kidney/ureter: No hydronephrosis or perinephric hematoma. --Left kidney/ureter: No hydronephrosis or perinephric hematoma. --Urinary bladder: Unremarkable. Stomach/Bowel: --Stomach/Duodenum: No hiatal hernia or other gastric abnormality. Normal duodenal course and caliber. --Small bowel: No dilatation or inflammation. --Colon: No focal abnormality. --Appendix: Normal. Vascular/Lymphatic: Normal course and caliber of the major abdominal vessels. --No retroperitoneal lymphadenopathy. --No mesenteric lymphadenopathy. --there are mildly enlarged inguinal lymph nodes bilaterally favored to be reactive. Reproductive: Unremarkable Other: There is diffuse body wall edema. There are pockets of subcutaneous gas involving the posterior thorax favored to be related to the patient's reported history of recent liposuction. In the anterior abdominal wall. There are fluid collections deep to the subcutaneous fat and superficial to the underlying muscular fascia. There is overlying skin thickening. Musculoskeletal. No acute displaced fractures. Review of the MIP images confirms the above findings. IMPRESSION: 1. No acute pulmonary embolism. 2. The lungs are clear. 3. No acute intra-abdominal process. 4. Sequela of finding involving the thorax and abdomen likely related to the patient's reported history recent liposuction. There are significant subcutaneous fluid collections involving the abdominal wall which may represent postoperative seromas or hematomas. A developing abscess is not excluded on this exam. Electronically Signed   By: Katherine Mantlehristopher  Green M.D.   On:  10/07/2019 00:50    Pending Labs Unresulted Labs (From admission, onward)    Start     Ordered   10/07/19 0500  HIV Antibody (routine testing w rflx)  (HIV Antibody (Routine testing w reflex) panel)  Tomorrow morning,   R     10/07/19 0235   10/07/19 0500  Comprehensive metabolic panel  Tomorrow morning,   R    Question:  Release to patient  Answer:  Immediate   10/07/19 0235   10/06/19 2002  Culture, blood (Routine x 2)  BLOOD CULTURE X 2,   STAT     10/06/19 2001   10/06/19 2002  Brain natriuretic peptide  Once,   STAT     10/06/19 2001          Vitals/Pain Today's Vitals   10/07/19 0129 10/07/19 0200 10/07/19 0342 10/07/19 0420  BP:  136/79 (!) 144/76   Pulse:  (!) 105 (!) 124   Resp:   (!) 27   Temp:      TempSrc:      SpO2:  99% 99%   Weight:      PainSc: 0-No pain   0-No pain    Isolation Precautions No active isolations  Medications Medications  acetaminophen (TYLENOL) tablet 650 mg (has no administration in time range)    Or  acetaminophen (TYLENOL) suppository 650 mg (has no administration in time range)  ondansetron (ZOFRAN) tablet 8 mg (8 mg Oral Given 10/07/19 0347)  oxyCODONE-acetaminophen (PERCOCET/ROXICET) 5-325 MG per tablet 1 tablet (has no administration in time range)    And  oxyCODONE (Oxy IR/ROXICODONE) immediate release tablet 5 mg (5 mg Oral  Given 10/07/19 0347)  morphine 4 MG/ML injection 4 mg (4 mg Intravenous Given 10/07/19 0007)  ondansetron (ZOFRAN) injection 4 mg (4 mg Intravenous Given 10/07/19 0007)  iohexol (OMNIPAQUE) 350 MG/ML injection 100 mL (100 mLs Intravenous Contrast Given 10/07/19 0030)  potassium chloride SA (KLOR-CON) CR tablet 20 mEq (20 mEq Oral Given 10/07/19 0347)    Mobility walks Low fall risk   Focused Assessments Cardiac Assessment Handoff:    Lab Results  Component Value Date   TROPONINI <0.03 04/01/2018   Lab Results  Component Value Date   DDIMER <0.27 04/01/2018   Does the Patient currently have chest  pain? No     R Recommendations: See Admitting Provider Note  Report given to:   Additional Notes:

## 2019-10-07 NOTE — Progress Notes (Signed)
Dr. Jacqulyn Bath notified about pt's MEWS score. Tylenol PO for now, will continue to monitor.

## 2019-10-07 NOTE — Progress Notes (Signed)
43 old female admitted early morning with a complaint of abdominal pain/swelling and anemia.  Patient seen and examined.  She continues to complain of bilateral buttock pain as well as mild suprapubic pain.  No other complaint.Per patient, she had BBL procedure done on 09/30/2019 with Dr. Thana Ates in [place].  She returned home about 2 days ago.  Per patient, she had abdominal massage done which was part of the procedure and there was significant amount of bleeding.  She has not seen any other bleeding since then.  Denies any vaginal bleeding or rectal bleeding or hematemesis.  General exam: Appears calm and comfortable  Respiratory system: Clear to auscultation. Respiratory effort normal. Cardiovascular system: S1 & S2 heard, RRR. No JVD, murmurs, rubs, gallops or clicks. No pedal edema. Gastrointestinal system: Abdomen is nondistended, soft and nontender. No organomegaly or masses felt. Normal bowel sounds heard. Central nervous system: Alert and oriented. No focal neurological deficits. Extremities: Symmetric 5 x 5 power. Skin: Prior C-section scar at the suprapubic area +5 cm small incision from her recent surgery which is clean, dry and intact without surrounding erythema, heat or induration.  No bleeding. Psychiatry: Judgement and insight appear normal. Mood & affect appropriate.    Acute blood loss anemia/postop anemia: Drop from 8-7 today.  Will order iron studies, folate and B12.  Suspect acute blood loss anemia secondary to postop anemia.  Will recheck hemoglobin later today.  If further drop less than 7, will transfuse.  Abdominal pain/seroma: Seen by general surgery.  Her incision from the surgery right above her prior C-section scar looks clean, intact with no bleeding or surrounding erythema.  General surgery does not recommend any intervention at this point in time and recommends only supportive care.  Follow-up with plastic surgeon as an outpatient.  We will monitor for fever.

## 2019-10-07 NOTE — Progress Notes (Addendum)
Notified by RN about patient's fever and tachycardia.  Tylenol ordered.  Patient tachycardia is likely reflective tachycardia secondary to temperature of 101.4.  Reviewed general surgery consultation note once again.  They recommend plastic surgery consultation either inpatient or outpatient.  Now that she seems to have signs of sepsis with possible superficial abdominal abscess, I consulted plastic surgeon on-call Dr. Ulice Bold.  I explained to her about patient's current vitals and history in detail.  I was told to transfer patient to [facility] if she is critical and that Dr. Ulice Bold will see patient only tomorrow.  Per Dr. Ulice Bold, her contract with [facility] is to work 5 hours a day, not 24 hours.  I did explain to her that she is listed as on-call plastic surgeon on amion and that is why she is being called.  She then told me that she will send her PA to see patient and will call me with her recommendations.  I also ordered lactic acid as well as starting her on normal saline at 125 cc/h.  Blood cultures were drawn yesterday.

## 2019-10-08 ENCOUNTER — Observation Stay (HOSPITAL_COMMUNITY): Payer: BC Managed Care – PPO

## 2019-10-08 ENCOUNTER — Other Ambulatory Visit: Payer: Self-pay

## 2019-10-08 DIAGNOSIS — L7634 Postprocedural seroma of skin and subcutaneous tissue following other procedure: Secondary | ICD-10-CM | POA: Diagnosis present

## 2019-10-08 DIAGNOSIS — D62 Acute posthemorrhagic anemia: Secondary | ICD-10-CM | POA: Diagnosis present

## 2019-10-08 DIAGNOSIS — Z20822 Contact with and (suspected) exposure to covid-19: Secondary | ICD-10-CM | POA: Diagnosis present

## 2019-10-08 DIAGNOSIS — I10 Essential (primary) hypertension: Secondary | ICD-10-CM | POA: Diagnosis present

## 2019-10-08 DIAGNOSIS — L02211 Cutaneous abscess of abdominal wall: Secondary | ICD-10-CM | POA: Diagnosis not present

## 2019-10-08 DIAGNOSIS — Y838 Other surgical procedures as the cause of abnormal reaction of the patient, or of later complication, without mention of misadventure at the time of the procedure: Secondary | ICD-10-CM | POA: Diagnosis present

## 2019-10-08 DIAGNOSIS — E876 Hypokalemia: Secondary | ICD-10-CM | POA: Diagnosis present

## 2019-10-08 DIAGNOSIS — T8144XA Sepsis following a procedure, initial encounter: Secondary | ICD-10-CM | POA: Diagnosis present

## 2019-10-08 DIAGNOSIS — D649 Anemia, unspecified: Secondary | ICD-10-CM | POA: Diagnosis present

## 2019-10-08 DIAGNOSIS — A419 Sepsis, unspecified organism: Secondary | ICD-10-CM | POA: Diagnosis present

## 2019-10-08 DIAGNOSIS — T8140XA Infection following a procedure, unspecified, initial encounter: Secondary | ICD-10-CM | POA: Diagnosis present

## 2019-10-08 LAB — FOLATE RBC
Folate, Hemolysate: 374 ng/mL
Folate, RBC: 1508 ng/mL (ref >498)
Hematocrit: 24.8 % — ABNORMAL LOW (ref 34.0–46.6)

## 2019-10-08 LAB — HEMOGLOBIN AND HEMATOCRIT, BLOOD
HCT: 30.2 % — ABNORMAL LOW (ref 36.0–46.0)
Hemoglobin: 10.3 g/dL — ABNORMAL LOW (ref 12.0–15.0)

## 2019-10-08 LAB — PREPARE RBC (CROSSMATCH)

## 2019-10-08 LAB — CBC
HCT: 20.5 % — ABNORMAL LOW (ref 36.0–46.0)
Hemoglobin: 6.9 g/dL — CL (ref 12.0–15.0)
MCH: 30.7 pg (ref 26.0–34.0)
MCHC: 33.7 g/dL (ref 30.0–36.0)
MCV: 91.1 fL (ref 80.0–100.0)
Platelets: 413 10*3/uL — ABNORMAL HIGH (ref 150–400)
RBC: 2.25 MIL/uL — ABNORMAL LOW (ref 3.87–5.11)
RDW: 12 % (ref 11.5–15.5)
WBC: 6.5 10*3/uL (ref 4.0–10.5)
nRBC: 0.3 % — ABNORMAL HIGH (ref 0.0–0.2)

## 2019-10-08 MED ORDER — FENTANYL CITRATE (PF) 100 MCG/2ML IJ SOLN
INTRAMUSCULAR | Status: AC | PRN
  Administered 2019-10-08: 50 ug via INTRAVENOUS
  Administered 2019-10-08: 25 ug via INTRAVENOUS

## 2019-10-08 MED ORDER — POTASSIUM CHLORIDE CRYS ER 20 MEQ PO TBCR
40.0000 meq | EXTENDED_RELEASE_TABLET | ORAL | Status: AC
  Administered 2019-10-08 (×2): 40 meq via ORAL
  Filled 2019-10-08 (×2): qty 2

## 2019-10-08 MED ORDER — MIDAZOLAM HCL 2 MG/2ML IJ SOLN
INTRAMUSCULAR | Status: AC | PRN
  Administered 2019-10-08: 1 mg via INTRAVENOUS
  Administered 2019-10-08: 0.5 mg via INTRAVENOUS

## 2019-10-08 MED ORDER — SODIUM CHLORIDE 0.9% IV SOLUTION: Freq: Once | INTRAVENOUS | Status: AC

## 2019-10-08 MED ORDER — LIDOCAINE HCL 1 % IJ SOLN
INTRAMUSCULAR | Status: AC
  Filled 2019-10-08: qty 20

## 2019-10-08 MED ORDER — LIDOCAINE HCL 1 % IJ SOLN
INTRAMUSCULAR | Status: AC | PRN
  Administered 2019-10-08: 10 mL

## 2019-10-08 MED ORDER — FENTANYL CITRATE (PF) 100 MCG/2ML IJ SOLN
INTRAMUSCULAR | Status: AC
  Filled 2019-10-08: qty 2

## 2019-10-08 MED ORDER — HYDROMORPHONE HCL 1 MG/ML IJ SOLN
0.5000 mg | Freq: Four times a day (QID) | INTRAMUSCULAR | Status: DC | PRN
  Administered 2019-10-08 – 2019-10-09 (×2): 0.5 mg via INTRAVENOUS
  Filled 2019-10-08 (×2): qty 1

## 2019-10-08 MED ORDER — MIDAZOLAM HCL 2 MG/2ML IJ SOLN
INTRAMUSCULAR | Status: AC
  Filled 2019-10-08: qty 2

## 2019-10-08 NOTE — Procedures (Signed)
Interventional Radiology Procedure Note  Procedure: US guided drainage of abd seroma.  ~455cc dark amber fluid aspirated. 32F drain. To bulb suction. .  Complications: None Recommendations:  - follow up culture - Do not submerge - Routine care   Signed,  Yvone Neu. Loreta Ave, DO

## 2019-10-08 NOTE — Plan of Care (Signed)

## 2019-10-08 NOTE — Consult Note (Addendum)
Chief Complaint: Patient was seen in consultation today for abdominal wall collection aspiration/drain placement Chief Complaint  Patient presents with   Shortness of Breath   at the request of Dr Guadlupe Spanish   Supervising Physician: Corrie Mckusick  Patient Status: [facility] - In-pt  History of Present Illness: Isabel Cole is a 34 y.o. female   Pt underwent liposuction/lipofill 09/30/19 in [facility] filling performed  Within few days pt noted abd swelling Becoming painful  Imaging yesterday:  IMPRESSION: 1. No acute pulmonary embolism. 2. The lungs are clear. 3. No acute intra-abdominal process. 4. Sequela of finding involving the thorax and abdomen likely related to the patient's reported history recent liposuction. There are significant subcutaneous fluid collections involving the abdominal wall which may represent postoperative seromas or hematomas. A developing abscess is not excluded on this exam.  Imaging reviewed with Dr Earleen Newport Now scheduled for abdominal collection aspiration/drain placement  Hg 6.9; now getting 2 units  Past Medical History:  Diagnosis Date   Gestational hypertension    Hypertension     Past Surgical History:  Procedure Laterality Date   CESAREAN SECTION     LIPOSUCTION      Allergies: Patient has no known allergies.  Medications: Prior to Admission medications   Medication Sig Start Date End Date Taking? Authorizing Provider  enoxaparin (LOVENOX) 40 MG/0.4ML injection Inject 40 mg into the skin daily.  09/30/19  Yes [provider]  ondansetron (ZOFRAN) 8 MG tablet Take 8 mg by mouth every 6 (six) hours as needed for nausea or vomiting.  09/30/19  Yes [provider]  oxyCODONE-acetaminophen (PERCOCET) 10-325 MG tablet Take 1 tablet by mouth every 4 (four) hours as needed. 09/30/19  Yes [provider]     Family History  Problem Relation Age of Onset   Cancer Mother     Social History     Socioeconomic History   Marital status: Single    Spouse name: Not on file   Number of children: Not on file   Years of education: Not on file   Highest education level: Not on file  Occupational History   Not on file  Tobacco Use   Smoking status: Never Smoker   Smokeless tobacco: Never Used  Substance and Sexual Activity   Alcohol use: No   Drug use: No   Sexual activity: Not on file  Other Topics Concern   Not on file  Social History Narrative   Not on file   Social Determinants of Health   Financial Resource Strain:    Difficulty of Paying Living Expenses: Not on file  Food Insecurity:    Worried About [place] in the Last Year: Not on file   Ran Out of Food in the Last Year: Not on file  Transportation Needs:    Lack of Transportation (Medical): Not on file   Lack of Transportation (Non-Medical): Not on file  Physical Activity:    Days of Exercise per Week: Not on file   Minutes of Exercise per Session: Not on file  Stress:    Feeling of Stress : Not on file  Social Connections:    Frequency of Communication with Friends and Family: Not on file   Frequency of Social Gatherings with Friends and Family: Not on file   Attends Religious Services: Not on file   Active Member of Clubs or Organizations: Not on file   Attends Archivist Meetings: Not on file   Marital Status:  Not on file    Review of Systems: A 12 point ROS discussed and pertinent positives are indicated in the HPI above.  All other systems are negative.  Review of Systems  Constitutional: Negative for appetite change, fatigue and fever.  Respiratory: Negative for cough and shortness of breath.   Cardiovascular: Negative for chest pain.  Gastrointestinal: Positive for abdominal distention and abdominal pain.  Psychiatric/Behavioral: Negative for behavioral problems and confusion.    Vital Signs: BP (!) 134/92 (BP Location: Left Arm)    Pulse 100     Temp 98.3 F (36.8 C) (Oral)    Resp 20    Ht 5\' 1"  (1.549 m)    Wt 178 lb (80.7 kg)    SpO2 100%    BMI 33.63 kg/m   Physical Exam Vitals reviewed.  Cardiovascular:     Rate and Rhythm: Normal rate and regular rhythm.  Pulmonary:     Effort: Pulmonary effort is normal.     Breath sounds: Normal breath sounds.  Abdominal:     Tenderness: There is abdominal tenderness.  Musculoskeletal:        General: Normal range of motion.  Skin:    General: Skin is warm and dry.  Neurological:     Mental Status: She is alert and oriented to person, place, and time.  Psychiatric:        Behavior: Behavior normal.     Imaging: DG Chest 2 View  Result Date: 10/06/2019 CLINICAL DATA:  Fever and tachycardia EXAM: CHEST - 2 VIEW COMPARISON:  04/01/2018 FINDINGS: The heart size and mediastinal contours are within normal limits. Both lungs are clear. The visualized skeletal structures are unremarkable. IMPRESSION: No active cardiopulmonary disease. Electronically Signed   By: 04/03/2018 M.D.   On: 10/06/2019 20:52   CT Angio Chest PE W and/or Wo Contrast  Result Date: 10/07/2019 CLINICAL DATA:  Abdominal distension.  Shortness of breath EXAM: CT ANGIOGRAPHY CHEST CT ABDOMEN AND PELVIS WITH CONTRAST TECHNIQUE: Multidetector CT imaging of the chest was performed using the standard protocol during bolus administration of intravenous contrast. Multiplanar CT image reconstructions and MIPs were obtained to evaluate the vascular anatomy. Multidetector CT imaging of the abdomen and pelvis was performed using the standard protocol during bolus administration of intravenous contrast. CONTRAST:  10/09/2019 OMNIPAQUE IOHEXOL 350 MG/ML SOLN COMPARISON:  None. FINDINGS: CTA CHEST FINDINGS Cardiovascular: Contrast injection is sufficient to demonstrate satisfactory opacification of the pulmonary arteries to the segmental level. There is no pulmonary embolus. The main pulmonary artery is within normal limits for size. There  is no CT evidence of acute right heart strain. The visualized aorta is normal. Heart size is normal, without pericardial effusion. Mediastinum/Nodes: --No mediastinal or hilar lymphadenopathy. --No axillary lymphadenopathy. --No supraclavicular lymphadenopathy. --the thyroid gland is enlarged without evidence for distinct thyroid nodule. --The esophagus is unremarkable Lungs/Pleura: No pulmonary nodules or masses. No pleural effusion or pneumothorax. No focal airspace consolidation. No focal pleural abnormality. Musculoskeletal: No chest wall abnormality. No acute or significant osseous findings. Review of the MIP images confirms the above findings. CT ABDOMEN and PELVIS FINDINGS Hepatobiliary: The liver is normal. Normal gallbladder.There is no biliary ductal dilation. Pancreas: Normal contours without ductal dilatation. No peripancreatic fluid collection. Spleen: No splenic laceration or hematoma. Adrenals/Urinary Tract: --Adrenal glands: No adrenal hemorrhage. --Right kidney/ureter: No hydronephrosis or perinephric hematoma. --Left kidney/ureter: No hydronephrosis or perinephric hematoma. --Urinary bladder: Unremarkable. Stomach/Bowel: --Stomach/Duodenum: No hiatal hernia or other gastric abnormality. Normal duodenal course and caliber. --Small  bowel: No dilatation or inflammation. --Colon: No focal abnormality. --Appendix: Normal. Vascular/Lymphatic: Normal course and caliber of the major abdominal vessels. --No retroperitoneal lymphadenopathy. --No mesenteric lymphadenopathy. --there are mildly enlarged inguinal lymph nodes bilaterally favored to be reactive. Reproductive: Unremarkable Other: There is diffuse body wall edema. There are pockets of subcutaneous gas involving the posterior thorax favored to be related to the patient's reported history of recent liposuction. In the anterior abdominal wall. There are fluid collections deep to the subcutaneous fat and superficial to the underlying muscular fascia.  There is overlying skin thickening. Musculoskeletal. No acute displaced fractures. Review of the MIP images confirms the above findings. IMPRESSION: 1. No acute pulmonary embolism. 2. The lungs are clear. 3. No acute intra-abdominal process. 4. Sequela of finding involving the thorax and abdomen likely related to the patient's reported history recent liposuction. There are significant subcutaneous fluid collections involving the abdominal wall which may represent postoperative seromas or hematomas. A developing abscess is not excluded on this exam. Electronically Signed   By: Katherine Mantle M.D.   On: 10/07/2019 00:50   CT ABDOMEN PELVIS W CONTRAST  Result Date: 10/07/2019 CLINICAL DATA:  Abdominal distension.  Shortness of breath EXAM: CT ANGIOGRAPHY CHEST CT ABDOMEN AND PELVIS WITH CONTRAST TECHNIQUE: Multidetector CT imaging of the chest was performed using the standard protocol during bolus administration of intravenous contrast. Multiplanar CT image reconstructions and MIPs were obtained to evaluate the vascular anatomy. Multidetector CT imaging of the abdomen and pelvis was performed using the standard protocol during bolus administration of intravenous contrast. CONTRAST:  OMNIPAQUE IOHEXOL 350 MG/ML SOLN COMPARISON:  None. FINDINGS: CTA CHEST FINDINGS Cardiovascular: Contrast injection is sufficient to demonstrate satisfactory opacification of the pulmonary arteries to the segmental level. There is no pulmonary embolus. The main pulmonary artery is within normal limits for size. There is no CT evidence of acute right heart strain. The visualized aorta is normal. Heart size is normal, without pericardial effusion. Mediastinum/Nodes: --No mediastinal or hilar lymphadenopathy. --No axillary lymphadenopathy. --No supraclavicular lymphadenopathy. --the thyroid gland is enlarged without evidence for distinct thyroid nodule. --The esophagus is unremarkable Lungs/Pleura: No pulmonary nodules or masses.  No pleural effusion or pneumothorax. No focal airspace consolidation. No focal pleural abnormality. Musculoskeletal: No chest wall abnormality. No acute or significant osseous findings. Review of the MIP images confirms the above findings. CT ABDOMEN and PELVIS FINDINGS Hepatobiliary: The liver is normal. Normal gallbladder.There is no biliary ductal dilation. Pancreas: Normal contours without ductal dilatation. No peripancreatic fluid collection. Spleen: No splenic laceration or hematoma. Adrenals/Urinary Tract: --Adrenal glands: No adrenal hemorrhage. --Right kidney/ureter: No hydronephrosis or perinephric hematoma. --Left kidney/ureter: No hydronephrosis or perinephric hematoma. --Urinary bladder: Unremarkable. Stomach/Bowel: --Stomach/Duodenum: No hiatal hernia or other gastric abnormality. Normal duodenal course and caliber. --Small bowel: No dilatation or inflammation. --Colon: No focal abnormality. --Appendix: Normal. Vascular/Lymphatic: Normal course and caliber of the major abdominal vessels. --No retroperitoneal lymphadenopathy. --No mesenteric lymphadenopathy. --there are mildly enlarged inguinal lymph nodes bilaterally favored to be reactive. Reproductive: Unremarkable Other: There is diffuse body wall edema. There are pockets of subcutaneous gas involving the posterior thorax favored to be related to the patient's reported history of recent liposuction. In the anterior abdominal wall. There are fluid collections deep to the subcutaneous fat and superficial to the underlying muscular fascia. There is overlying skin thickening. Musculoskeletal. No acute displaced fractures. Review of the MIP images confirms the above findings. IMPRESSION: 1. No acute pulmonary embolism. 2. The lungs are clear. 3. No acute intra-abdominal  process. 4. Sequela of finding involving the thorax and abdomen likely related to the patient's reported history recent liposuction. There are significant subcutaneous fluid collections  involving the abdominal wall which may represent postoperative seromas or hematomas. A developing abscess is not excluded on this exam. Electronically Signed   By: Katherine Mantle M.D.   On: 10/07/2019 00:50    Labs:  CBC: Recent Labs    10/06/19 2017 10/07/19 0406 10/07/19 1907 10/08/19 0220  WBC 8.4 6.8  --  6.5  HGB 8.0* 7.0* 7.9* 6.9*  HCT 23.8* 21.2* 23.9* 20.5*  PLT 398 385  --  413*    COAGS: Recent Labs    10/06/19 2017  INR 1.0    BMP: Recent Labs    10/06/19 2017 10/07/19 0406  NA 137 138  K 3.4* 3.1*  CL 99 99  CO2 29 29  GLUCOSE 96 124*  BUN <5* <5*  CALCIUM 8.5* 8.2*  CREATININE 0.82 0.70  GFRNONAA >60 >60  GFRAA >60 >60    LIVER FUNCTION TESTS: Recent Labs    10/06/19 2017 10/07/19 0406  BILITOT 0.8 1.0  AST 56* 52*  ALT 57* 55*  ALKPHOS 63 58  PROT 6.2* 5.9*  ALBUMIN 3.0* 2.7*    TUMOR MARKERS: No results for input(s): AFPTM, CEA, CA199, CHROMGRNA in the last 8760 hours.  Assessment and Plan:  Liposuction and lipofill 09/30/19 abd pain; swelling Imaging showing abd wall collection Scheduled for abdominal wall collection aspiration/drain placement Risks and benefits discussed with the patient including bleeding, infection, damage to adjacent structures,  and sepsis.  All of the patient's questions were answered, patient is agreeable to proceed. Consent signed and in chart.  Thank you for this interesting consult.  I greatly enjoyed meeting Mckinzie Saksa and look forward to participating in their care.  A copy of this report was sent to the requesting provider on this date.  Electronically Signed: Robet Leu, PA-C 10/08/2019, 11:55 AM   I spent a total of 40 Minutes    in face to face in clinical consultation, greater than 50% of which was counseling/coordinating care for abdominal wall collection aspiration/drainage placement

## 2019-10-08 NOTE — Progress Notes (Signed)
PROGRESS NOTE    Isabel Cole  UVO:[id number] DOB: 1986-07-17 DOA: 10/06/2019 PCP: Patient, No Pcp Per   Brief Narrative:  Isabel Cole  is a 34 y.o. female,  w hypertension, recently had lipsuction about 1 week ago, with fat transplant into buttock as well as cell saver procedure.  Pt states had had burning over the abdomen as well as some swelling, and therefore presented to ED.   In ED,  T 98.5, P 129, R 18, Bp 144/80  Pox 100% on RA Wt 80.7kg  CTA chest abd/ pelvis IMPRESSION: 1. No acute pulmonary embolism. 2. The lungs are clear. 3. No acute intra-abdominal process. 4. Sequela of finding involving the thorax and abdomen likely related to the patient's reported history recent liposuction. There are significant subcutaneous fluid collections involving the abdominal wall which may represent postoperative seromas or hematomas. A developing abscess is not excluded on this exam.  ED consulted surgery regarding ? Postoperative seroomas vs hematoma.  Per ED they will follow.   Wbc 8.4, Hgb 8.0, Plt 398 Na 137, K 3.4, Bun <5, Creatinine 0.82 Ast 56, Alt 57  Lactic acid 1.4  Assessment & Plan:   Principal Problem:   Anemia Active Problems:   Abnormal liver function   Abdominal wall abscess  Sepsis secondary to abdominal wall abscess: Patient meets sepsis criteria based on fever which was more than 101.4 and tachycardia.  This is likely secondary to abdominal wall abscess/seroma.  Seen by plastic surgery yesterday.  Consultation with IR and drain placement was recommended.  IR has been consulted this morning.  We will hold off to antibiotics until procedure done.  Acute blood loss anemia/postop anemia: No active bleeding.  Patient likely have postop/acute blood loss anemia.  Hemoglobin once again dropped to 6.9 this morning.  2 unit of PRBC transfusion were ordered by night team.  We will repeat H&H after that.  Monitor daily.  Hypokalemia: Replete and recheck in  the morning.  DVT prophylaxis: SCD.  Avoiding heparin products due to anemia Code Status: Full code Family Communication:  None present at bedside.  Plan of care discussed with patient in length and he verbalized understanding and agreed with it. Disposition Plan: Pending clinical improvement.  Patient to have intra-abdominal drain placement by IR today.  Will likely be discharged home in next 1 to 2 days.  Estimated body mass index is 33.63 kg/m as calculated from the following:   Height as of this encounter: 5\' 1"  (1.549 m).   Weight as of this encounter: 80.7 kg.      Nutritional status:               Consultants:   Plastic surgery and interventional radiology  Procedures:   None  Antimicrobials:   None   Subjective: Seen and examined.  Continues to have lower abdominal pain, not better or worse than yesterday.  Had fever yesterday again.  No other complaint.  Objective: Vitals:   10/08/19 0800 10/08/19 0833 10/08/19 0912 10/08/19 1127  BP:  134/86  (!) 134/92  Pulse:  (!) 103  100  Resp:  18  20  Temp: 98.9 F (37.2 C) 99.1 F (37.3 C)  98.3 F (36.8 C)  TempSrc: Oral Oral  Oral  SpO2:  100%  100%  Weight:      Height:   5\' 1"  (1.549 m)     Intake/Output Summary (Last 24 hours) at 10/08/2019 1349 Last data filed at 10/08/2019 1127 Gross per 24 hour  Intake 1032.33 ml  Output --  Net 1032.33 ml   Filed Weights   10/06/19 1959  Weight: 80.7 kg    Examination:  General exam: Appears calm and comfortable  Respiratory system: Clear to auscultation. Respiratory effort normal. Cardiovascular system: S1 & S2 heard, RRR. No JVD, murmurs, rubs, gallops or clicks. No pedal edema. Gastrointestinal system: Abdomen is nondistended, soft and lower abdominal tenderness. No organomegaly or masses felt. Normal bowel sounds heard.  She has recent surgical incision just over her previous C-section incision.  The incision looks clean, intact, without  induration but it is tender to palpation. Central nervous system: Alert and oriented. No focal neurological deficits. Extremities: Symmetric 5 x 5 power. Skin: No rashes, lesions or ulcers Psychiatry: Judgement and insight appear normal. Mood & affect appropriate.    Data Reviewed: I have personally reviewed following labs and imaging studies  CBC: Recent Labs  Lab 10/06/19 2017 10/07/19 0406 10/07/19 1907 10/08/19 0220  WBC 8.4 6.8  --  6.5  NEUTROABS 6.0  --   --   --   HGB 8.0* 7.0* 7.9* 6.9*  HCT 23.8* 21.2* 23.9* 20.5*  MCV 91.5 92.2  --  91.1  PLT 398 385  --  413*   Basic Metabolic Panel: Recent Labs  Lab 10/06/19 2017 10/07/19 0406  NA 137 138  K 3.4* 3.1*  CL 99 99  CO2 29 29  GLUCOSE 96 124*  BUN <5* <5*  CREATININE 0.82 0.70  CALCIUM 8.5* 8.2*   GFR: Estimated Creatinine Clearance: 94.5 mL/min (by C-G formula based on SCr of 0.7 mg/dL). Liver Function Tests: Recent Labs  Lab 10/06/19 2017 10/07/19 0406  AST 56* 52*  ALT 57* 55*  ALKPHOS 63 58  BILITOT 0.8 1.0  PROT 6.2* 5.9*  ALBUMIN 3.0* 2.7*   No results for input(s): LIPASE, AMYLASE in the last 168 hours. No results for input(s): AMMONIA in the last 168 hours. Coagulation Profile: Recent Labs  Lab 10/06/19 2017  INR 1.0   Cardiac Enzymes: No results for input(s): CKTOTAL, CKMB, CKMBINDEX, TROPONINI in the last 168 hours. BNP (last 3 results) No results for input(s): PROBNP in the last 8760 hours. HbA1C: No results for input(s): HGBA1C in the last 72 hours. CBG: No results for input(s): GLUCAP in the last 168 hours. Lipid Profile: No results for input(s): CHOL, HDL, LDLCALC, TRIG, CHOLHDL, LDLDIRECT in the last 72 hours. Thyroid Function Tests: No results for input(s): TSH, T4TOTAL, FREET4, T3FREE, THYROIDAB in the last 72 hours. Anemia Panel: Recent Labs    10/07/19 0824  VITAMINB12 896  FERRITIN 360*  TIBC 238*  IRON 32   Sepsis Labs: Recent Labs  Lab 10/06/19 2017  10/06/19 2333 10/07/19 1613 10/07/19 1907  LATICACIDVEN 1.4 1.6 1.3 1.2    Recent Results (from the past 240 hour(s))  Culture, blood (Routine x 2)     Status: None (Preliminary result)   Collection Time: 10/06/19  8:00 PM   Specimen: BLOOD RIGHT ARM  Result Value Ref Range Status   Specimen Description BLOOD RIGHT ARM  Final   Special Requests   Final    BOTTLES DRAWN AEROBIC AND ANAEROBIC Blood Culture results may not be optimal due to an excessive volume of blood received in culture bottles   Culture   Final    NO GROWTH 2 DAYS Performed at [facility], [address] [place], [place] [address]    Report Status PENDING  Incomplete  Culture, blood (Routine x  2)     Status: None (Preliminary result)   Collection Time: 10/06/19  8:22 PM   Specimen: BLOOD LEFT ARM  Result Value Ref Range Status   Specimen Description BLOOD LEFT ARM  Final   Special Requests   Final    BOTTLES DRAWN AEROBIC ONLY Blood Culture results may not be optimal due to an excessive volume of blood received in culture bottles   Culture   Final    NO GROWTH 2 DAYS Performed at [facility], [address] [place], [place] [address]    Report Status PENDING  Incomplete  SARS CORONAVIRUS 2 (TAT 6-24 HRS) Nasopharyngeal Nasopharyngeal Swab     Status: None   Collection Time: 10/07/19 12:10 AM   Specimen: Nasopharyngeal Swab  Result Value Ref Range Status   SARS Coronavirus 2 NEGATIVE NEGATIVE Final    Comment: (NOTE) SARS-CoV-2 target nucleic acids are NOT DETECTED. The SARS-CoV-2 RNA is generally detectable in upper and lower respiratory specimens during the acute phase of infection. Negative results do not preclude SARS-CoV-2 infection, do not rule out co-infections with other pathogens, and should not be used as the sole basis for treatment or other patient management decisions. Negative results must be combined with clinical observations, patient history, and epidemiological  information. The expected result is Negative. Fact Sheet for Patients: HairSlick.nohttps://www.fda.gov/media/138098/download Fact Sheet for Healthcare Providers: quierodirigir.comhttps://www.fda.gov/media/138095/download This test is not yet approved or cleared by the [place] FDA and  has been authorized for detection and/or diagnosis of SARS-CoV-2 by FDA under an Emergency Use Authorization (EUA). This EUA will remain  in effect (meaning this test can be used) for the duration of the COVID-19 declaration under Section 56 4(b)(1) of the Act, 21 U.S.C. section 360bbb-3(b)(1), unless the authorization is terminated or revoked sooner. Performed at [facility], [address] [place], [place] [address]       Radiology Studies: DG Chest 2 View  Result Date: 10/06/2019 CLINICAL DATA:  Fever and tachycardia EXAM: CHEST - 2 VIEW COMPARISON:  04/01/2018 FINDINGS: The heart size and mediastinal contours are within normal limits. Both lungs are clear. The visualized skeletal structures are unremarkable. IMPRESSION: No active cardiopulmonary disease. Electronically Signed   By: Deatra RobinsonKevin  Herman M.D.   On: 10/06/2019 20:52   CT Angio Chest PE W and/or Wo Contrast  Result Date: 10/07/2019 CLINICAL DATA:  Abdominal distension.  Shortness of breath EXAM: CT ANGIOGRAPHY CHEST CT ABDOMEN AND PELVIS WITH CONTRAST TECHNIQUE: Multidetector CT imaging of the chest was performed using the standard protocol during bolus administration of intravenous contrast. Multiplanar CT image reconstructions and MIPs were obtained to evaluate the vascular anatomy. Multidetector CT imaging of the abdomen and pelvis was performed using the standard protocol during bolus administration of intravenous contrast. CONTRAST:  100mL OMNIPAQUE IOHEXOL 350 MG/ML SOLN COMPARISON:  None. FINDINGS: CTA CHEST FINDINGS Cardiovascular: Contrast injection is sufficient to demonstrate satisfactory opacification of the pulmonary arteries to the segmental level.  There is no pulmonary embolus. The main pulmonary artery is within normal limits for size. There is no CT evidence of acute right heart strain. The visualized aorta is normal. Heart size is normal, without pericardial effusion. Mediastinum/Nodes: --No mediastinal or hilar lymphadenopathy. --No axillary lymphadenopathy. --No supraclavicular lymphadenopathy. --the thyroid gland is enlarged without evidence for distinct thyroid nodule. --The esophagus is unremarkable Lungs/Pleura: No pulmonary nodules or masses. No pleural effusion or pneumothorax. No focal airspace consolidation. No focal pleural abnormality. Musculoskeletal: No chest wall abnormality. No acute or  significant osseous findings. Review of the MIP images confirms the above findings. CT ABDOMEN and PELVIS FINDINGS Hepatobiliary: The liver is normal. Normal gallbladder.There is no biliary ductal dilation. Pancreas: Normal contours without ductal dilatation. No peripancreatic fluid collection. Spleen: No splenic laceration or hematoma. Adrenals/Urinary Tract: --Adrenal glands: No adrenal hemorrhage. --Right kidney/ureter: No hydronephrosis or perinephric hematoma. --Left kidney/ureter: No hydronephrosis or perinephric hematoma. --Urinary bladder: Unremarkable. Stomach/Bowel: --Stomach/Duodenum: No hiatal hernia or other gastric abnormality. Normal duodenal course and caliber. --Small bowel: No dilatation or inflammation. --Colon: No focal abnormality. --Appendix: Normal. Vascular/Lymphatic: Normal course and caliber of the major abdominal vessels. --No retroperitoneal lymphadenopathy. --No mesenteric lymphadenopathy. --there are mildly enlarged inguinal lymph nodes bilaterally favored to be reactive. Reproductive: Unremarkable Other: There is diffuse body wall edema. There are pockets of subcutaneous gas involving the posterior thorax favored to be related to the patient's reported history of recent liposuction. In the anterior abdominal wall. There are  fluid collections deep to the subcutaneous fat and superficial to the underlying muscular fascia. There is overlying skin thickening. Musculoskeletal. No acute displaced fractures. Review of the MIP images confirms the above findings. IMPRESSION: 1. No acute pulmonary embolism. 2. The lungs are clear. 3. No acute intra-abdominal process. 4. Sequela of finding involving the thorax and abdomen likely related to the patient's reported history recent liposuction. There are significant subcutaneous fluid collections involving the abdominal wall which may represent postoperative seromas or hematomas. A developing abscess is not excluded on this exam. Electronically Signed   By: Katherine Mantle M.D.   On: 10/07/2019 00:50   CT ABDOMEN PELVIS W CONTRAST  Result Date: 10/07/2019 CLINICAL DATA:  Abdominal distension.  Shortness of breath EXAM: CT ANGIOGRAPHY CHEST CT ABDOMEN AND PELVIS WITH CONTRAST TECHNIQUE: Multidetector CT imaging of the chest was performed using the standard protocol during bolus administration of intravenous contrast. Multiplanar CT image reconstructions and MIPs were obtained to evaluate the vascular anatomy. Multidetector CT imaging of the abdomen and pelvis was performed using the standard protocol during bolus administration of intravenous contrast. CONTRAST:  OMNIPAQUE IOHEXOL 350 MG/ML SOLN COMPARISON:  None. FINDINGS: CTA CHEST FINDINGS Cardiovascular: Contrast injection is sufficient to demonstrate satisfactory opacification of the pulmonary arteries to the segmental level. There is no pulmonary embolus. The main pulmonary artery is within normal limits for size. There is no CT evidence of acute right heart strain. The visualized aorta is normal. Heart size is normal, without pericardial effusion. Mediastinum/Nodes: --No mediastinal or hilar lymphadenopathy. --No axillary lymphadenopathy. --No supraclavicular lymphadenopathy. --the thyroid gland is enlarged without evidence for  distinct thyroid nodule. --The esophagus is unremarkable Lungs/Pleura: No pulmonary nodules or masses. No pleural effusion or pneumothorax. No focal airspace consolidation. No focal pleural abnormality. Musculoskeletal: No chest wall abnormality. No acute or significant osseous findings. Review of the MIP images confirms the above findings. CT ABDOMEN and PELVIS FINDINGS Hepatobiliary: The liver is normal. Normal gallbladder.There is no biliary ductal dilation. Pancreas: Normal contours without ductal dilatation. No peripancreatic fluid collection. Spleen: No splenic laceration or hematoma. Adrenals/Urinary Tract: --Adrenal glands: No adrenal hemorrhage. --Right kidney/ureter: No hydronephrosis or perinephric hematoma. --Left kidney/ureter: No hydronephrosis or perinephric hematoma. --Urinary bladder: Unremarkable. Stomach/Bowel: --Stomach/Duodenum: No hiatal hernia or other gastric abnormality. Normal duodenal course and caliber. --Small bowel: No dilatation or inflammation. --Colon: No focal abnormality. --Appendix: Normal. Vascular/Lymphatic: Normal course and caliber of the major abdominal vessels. --No retroperitoneal lymphadenopathy. --No mesenteric lymphadenopathy. --there are mildly enlarged inguinal lymph nodes bilaterally favored to be reactive. Reproductive: Unremarkable  Other: There is diffuse body wall edema. There are pockets of subcutaneous gas involving the posterior thorax favored to be related to the patient's reported history of recent liposuction. In the anterior abdominal wall. There are fluid collections deep to the subcutaneous fat and superficial to the underlying muscular fascia. There is overlying skin thickening. Musculoskeletal. No acute displaced fractures. Review of the MIP images confirms the above findings. IMPRESSION: 1. No acute pulmonary embolism. 2. The lungs are clear. 3. No acute intra-abdominal process. 4. Sequela of finding involving the thorax and abdomen likely related to  the patient's reported history recent liposuction. There are significant subcutaneous fluid collections involving the abdominal wall which may represent postoperative seromas or hematomas. A developing abscess is not excluded on this exam. Electronically Signed   By: Katherine Mantle M.D.   On: 10/07/2019 00:50    Scheduled Meds: Continuous Infusions: . sodium chloride 125 mL/hr at 10/07/19 1704     LOS: 0 days   Time spent: 33 minutes   Hughie Closs, MD [facility]  10/08/2019, 1:49 PM   To contact the attending provider between 7A-7P or the covering provider during after hours 7P-7A, please log into the web site [email or web address].

## 2019-10-08 NOTE — Progress Notes (Signed)
Pt very frustrated, has been here since Tuesday with the impression they were going to drain her abdomen to give her relief. Plastic surgery saw pt last night and wrote in note that IR was going to see pt to assess need for drain, Dr. Ulice Bold paged, her service team said she was in surgery and would place orders after she was out. Will continue to monitor

## 2019-10-08 NOTE — Sedation Documentation (Signed)
455cc removed by Doctor Loreta Ave

## 2019-10-09 ENCOUNTER — Other Ambulatory Visit: Payer: Self-pay | Admitting: Plastic Surgery

## 2019-10-09 DIAGNOSIS — E876 Hypokalemia: Secondary | ICD-10-CM

## 2019-10-09 DIAGNOSIS — L02211 Cutaneous abscess of abdominal wall: Secondary | ICD-10-CM

## 2019-10-09 DIAGNOSIS — D62 Acute posthemorrhagic anemia: Secondary | ICD-10-CM

## 2019-10-09 DIAGNOSIS — A419 Sepsis, unspecified organism: Secondary | ICD-10-CM

## 2019-10-09 LAB — TYPE AND SCREEN
ABO/RH(D): B POS
Antibody Screen: NEGATIVE
Unit division: 0
Unit division: 0

## 2019-10-09 LAB — CBC
HCT: 30.9 % — ABNORMAL LOW (ref 36.0–46.0)
Hemoglobin: 10.2 g/dL — ABNORMAL LOW (ref 12.0–15.0)
MCH: 30.8 pg (ref 26.0–34.0)
MCHC: 33 g/dL (ref 30.0–36.0)
MCV: 93.4 fL (ref 80.0–100.0)
Platelets: 489 10*3/uL — ABNORMAL HIGH (ref 150–400)
RBC: 3.31 MIL/uL — ABNORMAL LOW (ref 3.87–5.11)
RDW: 12 % (ref 11.5–15.5)
WBC: 6.4 10*3/uL (ref 4.0–10.5)
nRBC: 0 % (ref 0.0–0.2)

## 2019-10-09 LAB — BPAM RBC
Blood Product Expiration Date: 202102232359
Blood Product Expiration Date: 202102232359
ISSUE DATE / TIME: 202101210358
ISSUE DATE / TIME: 202101210805
Unit Type and Rh: 7300
Unit Type and Rh: 7300

## 2019-10-09 LAB — BASIC METABOLIC PANEL
Anion gap: 9 (ref 5–15)
BUN: 7 mg/dL (ref 6–20)
CO2: 27 mmol/L (ref 22–32)
Calcium: 8.6 mg/dL — ABNORMAL LOW (ref 8.9–10.3)
Chloride: 100 mmol/L (ref 98–111)
Creatinine, Ser: 0.68 mg/dL (ref 0.44–1.00)
GFR calc Af Amer: >60 mL/min (ref >60)
GFR calc non Af Amer: >60 mL/min (ref >60)
Glucose, Bld: 105 mg/dL — ABNORMAL HIGH (ref 70–99)
Potassium: 4.2 mmol/L (ref 3.5–5.1)
Sodium: 136 mmol/L (ref 135–145)

## 2019-10-09 MED ORDER — OXYCODONE-ACETAMINOPHEN 10-325 MG PO TABS: 1.0000 | ORAL_TABLET | ORAL | 0 refills | Status: DC | PRN

## 2019-10-09 MED ORDER — ONDANSETRON HCL 8 MG PO TABS: 8.0000 mg | ORAL_TABLET | Freq: Four times a day (QID) | ORAL | 0 refills | Status: DC | PRN

## 2019-10-09 NOTE — Progress Notes (Signed)
Referring Physician(s): Dr. Jacqulyn Bath  Supervising Physician: Richarda Overlie  Patient Status:  [facility] - In-pt  Chief Complaint: Post-op seroma  Subjective: Dressed and ready for discharge today.  States she is feeling much better since drain placement.   Allergies: Patient has no known allergies.  Medications: Prior to Admission medications   Medication Sig Start Date End Date Taking? Authorizing Provider  enoxaparin (LOVENOX) 40 MG/0.4ML injection Inject 40 mg into the skin daily.  09/30/19  Yes [provider]  ondansetron (ZOFRAN) 8 MG tablet Take 1 tablet (8 mg total) by mouth every 6 (six) hours as needed for nausea or vomiting. 10/09/19   Edsel Petrin, DO  oxyCODONE-acetaminophen (PERCOCET) 10-325 MG tablet Take 1 tablet by mouth every 4 (four) hours as needed. 10/09/19   Edsel Petrin, DO     Vital Signs: BP 116/74 (BP Location: Left Arm)   Pulse 93   Temp 98.3 F (36.8 C) (Oral)   Resp 16   Ht 5\' 1"  (1.549 m)   Wt 178 lb (80.7 kg)   SpO2 99%   BMI 33.63 kg/m   Physical Exam  NAD, alert Abdomen: soft, non-tender. Left lateral drain intact.  Insertion site c/d/i.  Serous fluid in bulb.   Imaging: DG Chest 2 View  Result Date: 10/06/2019 CLINICAL DATA:  Fever and tachycardia EXAM: CHEST - 2 VIEW COMPARISON:  04/01/2018 FINDINGS: The heart size and mediastinal contours are within normal limits. Both lungs are clear. The visualized skeletal structures are unremarkable. IMPRESSION: No active cardiopulmonary disease. Electronically Signed   By: 04/03/2018 M.D.   On: 10/06/2019 20:52   CT Angio Chest PE W and/or Wo Contrast  Result Date: 10/07/2019 CLINICAL DATA:  Abdominal distension.  Shortness of breath EXAM: CT ANGIOGRAPHY CHEST CT ABDOMEN AND PELVIS WITH CONTRAST TECHNIQUE: Multidetector CT imaging of the chest was performed using the standard protocol during bolus administration of intravenous contrast. Multiplanar CT image reconstructions and MIPs  were obtained to evaluate the vascular anatomy. Multidetector CT imaging of the abdomen and pelvis was performed using the standard protocol during bolus administration of intravenous contrast. CONTRAST:  10/09/2019 OMNIPAQUE IOHEXOL 350 MG/ML SOLN COMPARISON:  None. FINDINGS: CTA CHEST FINDINGS Cardiovascular: Contrast injection is sufficient to demonstrate satisfactory opacification of the pulmonary arteries to the segmental level. There is no pulmonary embolus. The main pulmonary artery is within normal limits for size. There is no CT evidence of acute right heart strain. The visualized aorta is normal. Heart size is normal, without pericardial effusion. Mediastinum/Nodes: --No mediastinal or hilar lymphadenopathy. --No axillary lymphadenopathy. --No supraclavicular lymphadenopathy. --the thyroid gland is enlarged without evidence for distinct thyroid nodule. --The esophagus is unremarkable Lungs/Pleura: No pulmonary nodules or masses. No pleural effusion or pneumothorax. No focal airspace consolidation. No focal pleural abnormality. Musculoskeletal: No chest wall abnormality. No acute or significant osseous findings. Review of the MIP images confirms the above findings. CT ABDOMEN and PELVIS FINDINGS Hepatobiliary: The liver is normal. Normal gallbladder.There is no biliary ductal dilation. Pancreas: Normal contours without ductal dilatation. No peripancreatic fluid collection. Spleen: No splenic laceration or hematoma. Adrenals/Urinary Tract: --Adrenal glands: No adrenal hemorrhage. --Right kidney/ureter: No hydronephrosis or perinephric hematoma. --Left kidney/ureter: No hydronephrosis or perinephric hematoma. --Urinary bladder: Unremarkable. Stomach/Bowel: --Stomach/Duodenum: No hiatal hernia or other gastric abnormality. Normal duodenal course and caliber. --Small bowel: No dilatation or inflammation. --Colon: No focal abnormality. --Appendix: Normal. Vascular/Lymphatic: Normal course and caliber of the major  abdominal vessels. --No retroperitoneal lymphadenopathy. --No mesenteric lymphadenopathy. --there  are mildly enlarged inguinal lymph nodes bilaterally favored to be reactive. Reproductive: Unremarkable Other: There is diffuse body wall edema. There are pockets of subcutaneous gas involving the posterior thorax favored to be related to the patient's reported history of recent liposuction. In the anterior abdominal wall. There are fluid collections deep to the subcutaneous fat and superficial to the underlying muscular fascia. There is overlying skin thickening. Musculoskeletal. No acute displaced fractures. Review of the MIP images confirms the above findings. IMPRESSION: 1. No acute pulmonary embolism. 2. The lungs are clear. 3. No acute intra-abdominal process. 4. Sequela of finding involving the thorax and abdomen likely related to the patient's reported history recent liposuction. There are significant subcutaneous fluid collections involving the abdominal wall which may represent postoperative seromas or hematomas. A developing abscess is not excluded on this exam. Electronically Signed   By: Katherine Mantle M.D.   On: 10/07/2019 00:50   CT ABDOMEN PELVIS W CONTRAST  Result Date: 10/07/2019 CLINICAL DATA:  Abdominal distension.  Shortness of breath EXAM: CT ANGIOGRAPHY CHEST CT ABDOMEN AND PELVIS WITH CONTRAST TECHNIQUE: Multidetector CT imaging of the chest was performed using the standard protocol during bolus administration of intravenous contrast. Multiplanar CT image reconstructions and MIPs were obtained to evaluate the vascular anatomy. Multidetector CT imaging of the abdomen and pelvis was performed using the standard protocol during bolus administration of intravenous contrast. CONTRAST:  OMNIPAQUE IOHEXOL 350 MG/ML SOLN COMPARISON:  None. FINDINGS: CTA CHEST FINDINGS Cardiovascular: Contrast injection is sufficient to demonstrate satisfactory opacification of the pulmonary arteries to the  segmental level. There is no pulmonary embolus. The main pulmonary artery is within normal limits for size. There is no CT evidence of acute right heart strain. The visualized aorta is normal. Heart size is normal, without pericardial effusion. Mediastinum/Nodes: --No mediastinal or hilar lymphadenopathy. --No axillary lymphadenopathy. --No supraclavicular lymphadenopathy. --the thyroid gland is enlarged without evidence for distinct thyroid nodule. --The esophagus is unremarkable Lungs/Pleura: No pulmonary nodules or masses. No pleural effusion or pneumothorax. No focal airspace consolidation. No focal pleural abnormality. Musculoskeletal: No chest wall abnormality. No acute or significant osseous findings. Review of the MIP images confirms the above findings. CT ABDOMEN and PELVIS FINDINGS Hepatobiliary: The liver is normal. Normal gallbladder.There is no biliary ductal dilation. Pancreas: Normal contours without ductal dilatation. No peripancreatic fluid collection. Spleen: No splenic laceration or hematoma. Adrenals/Urinary Tract: --Adrenal glands: No adrenal hemorrhage. --Right kidney/ureter: No hydronephrosis or perinephric hematoma. --Left kidney/ureter: No hydronephrosis or perinephric hematoma. --Urinary bladder: Unremarkable. Stomach/Bowel: --Stomach/Duodenum: No hiatal hernia or other gastric abnormality. Normal duodenal course and caliber. --Small bowel: No dilatation or inflammation. --Colon: No focal abnormality. --Appendix: Normal. Vascular/Lymphatic: Normal course and caliber of the major abdominal vessels. --No retroperitoneal lymphadenopathy. --No mesenteric lymphadenopathy. --there are mildly enlarged inguinal lymph nodes bilaterally favored to be reactive. Reproductive: Unremarkable Other: There is diffuse body wall edema. There are pockets of subcutaneous gas involving the posterior thorax favored to be related to the patient's reported history of recent liposuction. In the anterior abdominal  wall. There are fluid collections deep to the subcutaneous fat and superficial to the underlying muscular fascia. There is overlying skin thickening. Musculoskeletal. No acute displaced fractures. Review of the MIP images confirms the above findings. IMPRESSION: 1. No acute pulmonary embolism. 2. The lungs are clear. 3. No acute intra-abdominal process. 4. Sequela of finding involving the thorax and abdomen likely related to the patient's reported history recent liposuction. There are significant subcutaneous fluid collections involving the abdominal  wall which may represent postoperative seromas or hematomas. A developing abscess is not excluded on this exam. Electronically Signed   By: Katherine Mantle M.D.   On: 10/07/2019 00:50   IR IMAGE GUIDED DRAINAGE BY PERCUTANEOUS CATHETER  Result Date: 10/08/2019 INDICATION: 34 year old female with a history of liposuction EXAM: ULTRASOUND-GUIDED DRAINAGE OF ABDOMINAL WALL SEROMA MEDICATIONS: The patient is currently admitted to the hospital and receiving intravenous antibiotics. The antibiotics were administered within an appropriate time frame prior to the initiation of the procedure. ANESTHESIA/SEDATION: Fentanyl 75 mcg IV; Versed 1.5 mg IV Moderate Sedation Time:  22 MINUTES The patient was continuously monitored during the procedure by the interventional radiology nurse under my direct supervision. COMPLICATIONS: None PROCEDURE: Informed written consent was obtained from the patient after a thorough discussion of the procedural risks, benefits and alternatives. All questions were addressed. Maximal Sterile Barrier Technique was utilized including caps, mask, sterile gowns, sterile gloves, sterile drape, hand hygiene and skin antiseptic. A timeout was performed prior to the initiation of the procedure. Ultrasound images of the anterior abdominal seroma performed. The patient is prepped and draped in the usual sterile fashion. 1% lidocaine was used for local  anesthesia. Using ultrasound guidance, trocar needle was advanced into the fluid collection of the low abdomen from a left lower abdomen approach. Once we confirmed needle position modified Seldinger technique was used to place a 12 [place] biliary drain. The drain was sutured in position and approximately 455 cc of thin dark amber fluid aspirated. Drain was attached to bulb suction. Patient tolerated the procedure well and remained hemodynamically stable throughout. No complications were encountered and no significant blood loss IMPRESSION: Status post ultrasound-guided drainage of abdominal wall seroma, with placement of a 12 [place] biliary drain. Signed, Yvone Neu. Reyne Dumas, RPVI [facility] Electronically Signed   By: Gilmer Mor D.O.   On: 10/08/2019 17:22    Labs:  CBC: Recent Labs    10/06/19 2017 10/06/19 2017 10/07/19 0406 10/07/19 0824 10/07/19 1907 10/08/19 0220 10/08/19 1340 10/09/19 0552  WBC 8.4  --  6.8  --   --  6.5  --  6.4  HGB 8.0*   < > 7.0*   < > 7.9* 6.9* 10.3* 10.2*  HCT 23.8*   < > 21.2*   < > 23.9* 20.5* 30.2* 30.9*  PLT 398  --  385  --   --  413*  --  489*   < > = values in this interval not displayed.    COAGS: Recent Labs    10/06/19 2017  INR 1.0    BMP: Recent Labs    10/06/19 2017 10/07/19 0406 10/09/19 0552  NA 137 138 136  K 3.4* 3.1* 4.2  CL 99 99 100  CO2 29 29 27   GLUCOSE 96 124* 105*  BUN <5* <5* 7  CALCIUM 8.5* 8.2* 8.6*  CREATININE 0.82 0.70 0.68  GFRNONAA >60 >60 >60  GFRAA >60 >60 >60    LIVER FUNCTION TESTS: Recent Labs    10/06/19 2017 10/07/19 0406  BILITOT 0.8 1.0  AST 56* 52*  ALT 57* 55*  ALKPHOS 63 58  PROT 6.2* 5.9*  ALBUMIN 3.0* 2.7*    Assessment and Plan: Post-op seroma s/p drain placement 1/21 by Dr. 2/21.  Patient reports feeling much better.  Cultures are negative.  No WBC elevation.  Afebrile. 455 mL out at time of placement, another  400 mL overnight. Patient will discharge home with drain  in place.  Flush drain daily with 5-10 mL sterile saline. Dressing changes Q3days or PRN if soiled/wet and record output once daily.  Appreciate RN providing education on flushing/drain care. Patient will see [place] in clinic in 1 week with follow-up CT. I have placed drain care instructions and follow up contact information in her discharge summary today.    Electronically Signed: Docia Barrier, PA 10/09/2019, 3:23 PM   I spent a total of 15 Minutes at the the patient's bedside AND on the patient's hospital floor or unit, greater than 50% of which was counseling/coordinating care for post-op seroma, abdominal wall fluid collection.

## 2019-10-09 NOTE — Progress Notes (Signed)
Pt is ambulating independently. LLQ JP drain intact and compressed. Taught pt on emptying and flushing the JP drain. Discharge instructions given to pt, verbalized understanding. Discharged to home.

## 2019-10-09 NOTE — Plan of Care (Signed)

## 2019-10-09 NOTE — Progress Notes (Signed)
Dressing changed, 4x4 gauze and paper tape. Little to no drainage(blood). Total of drained from JP drain.

## 2019-10-09 NOTE — Discharge Summary (Signed)
Physician Discharge Summary  Chane Magner YHC:[id number] DOB: 02-14-1986 DOA: 10/06/2019  PCP: Patient, No Pcp Per  Admit date: 10/06/2019 Discharge date: 10/09/2019  Time spent: 45 minutes  Recommendations for Outpatient Follow-up:  Patient will be discharged to home.  Patient will need to follow up with primary care provider within one week of discharge, repeat CBC and BMP. Follow up with interventional radiology/drain clinic.  Patient should continue medications as prescribed.  Patient should follow a regular diet.   Discharge Diagnoses:  Sepsis secondary to abdominal wall seroma Acute blood loss anemia/postoperative anemia Hypokalemia  Discharge Condition: Stable  Diet recommendation: Regular  Filed Weights   10/06/19 1959  Weight: 80.7 kg    History of present illness:  On 10/07/19 by Dr. Jani Gravel Emmer Lillibridge  is a 34 y.o. female,  w hypertension, recently had lipsuction about 1 week ago, with fat transplant into buttock as well as cell saver procedure.  Pt states had had burning over the abdomen as well as some swelling, and therefore presented to ED.   Hospital Course:  Sepsis secondary to abdominal wall seroma -Present on admission, patient did have fever and tachycardia-both have resolved -Plastic surgery was consulted and appreciated, recommended IR for drainage of seroma and possible drain placement if needed.  Continue hydration and increase protein intake.  Follow-up with operating surgeon. -Interventional radiology consulted and appreciated, status post ultrasound-guided drainage of abdominal seroma with 455 cc of dark amber fluid which is aspirated.  12 French drain placed to suction.. Follow-up culture do not submerge, routine care -Surgical/wound culture shows no organisms or white blood cells seen. -Blood culture showed no growth to date  Acute blood loss anemia/postoperative anemia -No active bleed; patient presented to the ER with a hemoglobin of  7 -Hemoglobin did drop to 6.9 on 09/06/2020 -Patient was transfused 2 units PRBC -Hemoglobin currently 10.2 -Repeat CBC in 1 week -Of note patient was placed on Lovenox injections prior to admission- however only for 7 days  Hypokalemia -Resolved with replacement  Procedures: Ultrasound-guided drainage of abdominal seroma, 12 French drain placed to suction  Consultations: Plastic surgery Interventional radiology  Discharge Exam: Vitals:   10/09/19 0422 10/09/19 0735  BP: 127/72 116/74  Pulse: 94 93  Resp: 16 16  Temp: 98.7 F (37.1 C) 98.3 F (36.8 C)  SpO2: 99% 99%     General: Well developed, well nourished, NAD, appears stated age  HEENT: NCAT,  mucous membranes moist.  Cardiovascular: S1 S2 auscultated, regular rate and rhythm, no murmur  Respiratory: Clear to auscultation bilaterally with equal chest rise  Abdomen: Soft, mildly tender, nondistended, + bowel sounds  Extremities: warm dry without cyanosis clubbing or edema  Neuro: AAOx3, nonfocal  Psych: Appropriate mood and affect, pleasant  Discharge Instructions Discharge Instructions    Discharge instructions   Complete by: As directed    Patient will be discharged to home.  Patient will need to follow up with primary care provider within one week of discharge, repeat CBC and BMP. Follow up with interventional radiology/drain clinic.  Patient should continue medications as prescribed.  Patient should follow a regular diet.   Discharge instructions   Complete by: As directed    Drain to remain in place at discharge.  Flush drain with 5 mL sterile saline daily.  Keep a log of the output daily and bring this information with you to follow-up appointments.  Schedulers will contact you for date and time of follow-up appointment with Interventional Radiology.  Allergies as of 10/09/2019   No Known Allergies     Medication List    STOP taking these medications   enoxaparin 40 MG/0.4ML  injection Commonly known as: LOVENOX     TAKE these medications   ondansetron 8 MG tablet Commonly known as: ZOFRAN Take 1 tablet (8 mg total) by mouth every 6 (six) hours as needed for nausea or vomiting.   oxyCODONE-acetaminophen 10-325 MG tablet Commonly known as: PERCOCET Take 1 tablet by mouth every 4 (four) hours as needed.      No Known Allergies Follow-up Information    Jodelle Red, MD. Schedule an appointment as soon as possible for a visit in 1 week(s).   Specialty: Cardiology Why: Hospital follow up Contact information: [address] [place] [address] [phone]        Richarda Overlie, MD Follow up.   Specialties: Interventional Radiology, Radiology Why: Schedulers to call with date and time of appointment. Contact information: [address] [place] [address] [phone]            The results of significant diagnostics from this hospitalization (including imaging, microbiology, ancillary and laboratory) are listed below for reference.    Significant Diagnostic Studies: DG Chest 2 View  Result Date: 10/06/2019 CLINICAL DATA:  Fever and tachycardia EXAM: CHEST - 2 VIEW COMPARISON:  04/01/2018 FINDINGS: The heart size and mediastinal contours are within normal limits. Both lungs are clear. The visualized skeletal structures are unremarkable. IMPRESSION: No active cardiopulmonary disease. Electronically Signed   By: Deatra Robinson M.D.   On: 10/06/2019 20:52   CT Angio Chest PE W and/or Wo Contrast  Result Date: 10/07/2019 CLINICAL DATA:  Abdominal distension.  Shortness of breath EXAM: CT ANGIOGRAPHY CHEST CT ABDOMEN AND PELVIS WITH CONTRAST TECHNIQUE: Multidetector CT imaging of the chest was performed using the standard protocol during bolus administration of intravenous contrast. Multiplanar CT image reconstructions and MIPs were obtained to evaluate the vascular anatomy. Multidetector CT imaging of the abdomen  and pelvis was performed using the standard protocol during bolus administration of intravenous contrast. CONTRAST:  OMNIPAQUE IOHEXOL 350 MG/ML SOLN COMPARISON:  None. FINDINGS: CTA CHEST FINDINGS Cardiovascular: Contrast injection is sufficient to demonstrate satisfactory opacification of the pulmonary arteries to the segmental level. There is no pulmonary embolus. The main pulmonary artery is within normal limits for size. There is no CT evidence of acute right heart strain. The visualized aorta is normal. Heart size is normal, without pericardial effusion. Mediastinum/Nodes: --No mediastinal or hilar lymphadenopathy. --No axillary lymphadenopathy. --No supraclavicular lymphadenopathy. --the thyroid gland is enlarged without evidence for distinct thyroid nodule. --The esophagus is unremarkable Lungs/Pleura: No pulmonary nodules or masses. No pleural effusion or pneumothorax. No focal airspace consolidation. No focal pleural abnormality. Musculoskeletal: No chest wall abnormality. No acute or significant osseous findings. Review of the MIP images confirms the above findings. CT ABDOMEN and PELVIS FINDINGS Hepatobiliary: The liver is normal. Normal gallbladder.There is no biliary ductal dilation. Pancreas: Normal contours without ductal dilatation. No peripancreatic fluid collection. Spleen: No splenic laceration or hematoma. Adrenals/Urinary Tract: --Adrenal glands: No adrenal hemorrhage. --Right kidney/ureter: No hydronephrosis or perinephric hematoma. --Left kidney/ureter: No hydronephrosis or perinephric hematoma. --Urinary bladder: Unremarkable. Stomach/Bowel: --Stomach/Duodenum: No hiatal hernia or other gastric abnormality. Normal duodenal course and caliber. --Small bowel: No dilatation or inflammation. --Colon: No focal abnormality. --Appendix: Normal. Vascular/Lymphatic: Normal course and caliber of the major abdominal vessels. --No retroperitoneal lymphadenopathy. --No mesenteric lymphadenopathy.  --there are mildly enlarged  inguinal lymph nodes bilaterally favored to be reactive. Reproductive: Unremarkable Other: There is diffuse body wall edema. There are pockets of subcutaneous gas involving the posterior thorax favored to be related to the patient's reported history of recent liposuction. In the anterior abdominal wall. There are fluid collections deep to the subcutaneous fat and superficial to the underlying muscular fascia. There is overlying skin thickening. Musculoskeletal. No acute displaced fractures. Review of the MIP images confirms the above findings. IMPRESSION: 1. No acute pulmonary embolism. 2. The lungs are clear. 3. No acute intra-abdominal process. 4. Sequela of finding involving the thorax and abdomen likely related to the patient's reported history recent liposuction. There are significant subcutaneous fluid collections involving the abdominal wall which may represent postoperative seromas or hematomas. A developing abscess is not excluded on this exam. Electronically Signed   By: Katherine Mantlehristopher  Green M.D.   On: 10/07/2019 00:50   CT ABDOMEN PELVIS W CONTRAST  Result Date: 10/07/2019 CLINICAL DATA:  Abdominal distension.  Shortness of breath EXAM: CT ANGIOGRAPHY CHEST CT ABDOMEN AND PELVIS WITH CONTRAST TECHNIQUE: Multidetector CT imaging of the chest was performed using the standard protocol during bolus administration of intravenous contrast. Multiplanar CT image reconstructions and MIPs were obtained to evaluate the vascular anatomy. Multidetector CT imaging of the abdomen and pelvis was performed using the standard protocol during bolus administration of intravenous contrast. CONTRAST:  100mL OMNIPAQUE IOHEXOL 350 MG/ML SOLN COMPARISON:  None. FINDINGS: CTA CHEST FINDINGS Cardiovascular: Contrast injection is sufficient to demonstrate satisfactory opacification of the pulmonary arteries to the segmental level. There is no pulmonary embolus. The main pulmonary artery is within normal  limits for size. There is no CT evidence of acute right heart strain. The visualized aorta is normal. Heart size is normal, without pericardial effusion. Mediastinum/Nodes: --No mediastinal or hilar lymphadenopathy. --No axillary lymphadenopathy. --No supraclavicular lymphadenopathy. --the thyroid gland is enlarged without evidence for distinct thyroid nodule. --The esophagus is unremarkable Lungs/Pleura: No pulmonary nodules or masses. No pleural effusion or pneumothorax. No focal airspace consolidation. No focal pleural abnormality. Musculoskeletal: No chest wall abnormality. No acute or significant osseous findings. Review of the MIP images confirms the above findings. CT ABDOMEN and PELVIS FINDINGS Hepatobiliary: The liver is normal. Normal gallbladder.There is no biliary ductal dilation. Pancreas: Normal contours without ductal dilatation. No peripancreatic fluid collection. Spleen: No splenic laceration or hematoma. Adrenals/Urinary Tract: --Adrenal glands: No adrenal hemorrhage. --Right kidney/ureter: No hydronephrosis or perinephric hematoma. --Left kidney/ureter: No hydronephrosis or perinephric hematoma. --Urinary bladder: Unremarkable. Stomach/Bowel: --Stomach/Duodenum: No hiatal hernia or other gastric abnormality. Normal duodenal course and caliber. --Small bowel: No dilatation or inflammation. --Colon: No focal abnormality. --Appendix: Normal. Vascular/Lymphatic: Normal course and caliber of the major abdominal vessels. --No retroperitoneal lymphadenopathy. --No mesenteric lymphadenopathy. --there are mildly enlarged inguinal lymph nodes bilaterally favored to be reactive. Reproductive: Unremarkable Other: There is diffuse body wall edema. There are pockets of subcutaneous gas involving the posterior thorax favored to be related to the patient's reported history of recent liposuction. In the anterior abdominal wall. There are fluid collections deep to the subcutaneous fat and superficial to the  underlying muscular fascia. There is overlying skin thickening. Musculoskeletal. No acute displaced fractures. Review of the MIP images confirms the above findings. IMPRESSION: 1. No acute pulmonary embolism. 2. The lungs are clear. 3. No acute intra-abdominal process. 4. Sequela of finding involving the thorax and abdomen likely related to the patient's reported history recent liposuction. There are significant subcutaneous fluid collections involving the abdominal wall which may  represent postoperative seromas or hematomas. A developing abscess is not excluded on this exam. Electronically Signed   By: Katherine Mantle M.D.   On: 10/07/2019 00:50   IR IMAGE GUIDED DRAINAGE BY PERCUTANEOUS CATHETER  Result Date: 10/08/2019 INDICATION: 34 year old female with a history of liposuction EXAM: ULTRASOUND-GUIDED DRAINAGE OF ABDOMINAL WALL SEROMA MEDICATIONS: The patient is currently admitted to the hospital and receiving intravenous antibiotics. The antibiotics were administered within an appropriate time frame prior to the initiation of the procedure. ANESTHESIA/SEDATION: Fentanyl 75 mcg IV; Versed 1.5 mg IV Moderate Sedation Time:  22 MINUTES The patient was continuously monitored during the procedure by the interventional radiology nurse under my direct supervision. COMPLICATIONS: None PROCEDURE: Informed written consent was obtained from the patient after a thorough discussion of the procedural risks, benefits and alternatives. All questions were addressed. Maximal Sterile Barrier Technique was utilized including caps, mask, sterile gowns, sterile gloves, sterile drape, hand hygiene and skin antiseptic. A timeout was performed prior to the initiation of the procedure. Ultrasound images of the anterior abdominal seroma performed. The patient is prepped and draped in the usual sterile fashion. 1% lidocaine was used for local anesthesia. Using ultrasound guidance, trocar needle was advanced into the fluid  collection of the low abdomen from a left lower abdomen approach. Once we confirmed needle position modified Seldinger technique was used to place a 12 [place] biliary drain. The drain was sutured in position and approximately 455 cc of thin dark amber fluid aspirated. Drain was attached to bulb suction. Patient tolerated the procedure well and remained hemodynamically stable throughout. No complications were encountered and no significant blood loss IMPRESSION: Status post ultrasound-guided drainage of abdominal wall seroma, with placement of a 12 [place] biliary drain. Signed, Yvone Neu. Reyne Dumas, RPVI [facility] Electronically Signed   By: Gilmer Mor D.O.   On: 10/08/2019 17:22    Microbiology: Recent Results (from the past 240 hour(s))  Culture, blood (Routine x 2)     Status: None (Preliminary result)   Collection Time: 10/06/19  8:00 PM   Specimen: BLOOD RIGHT ARM  Result Value Ref Range Status   Specimen Description BLOOD RIGHT ARM  Final   Special Requests   Final    BOTTLES DRAWN AEROBIC AND ANAEROBIC Blood Culture results may not be optimal due to an excessive volume of blood received in culture bottles   Culture   Final    NO GROWTH 3 DAYS Performed at [facility], [address] [place], [place] [address]    Report Status PENDING  Incomplete  Culture, blood (Routine x 2)     Status: None (Preliminary result)   Collection Time: 10/06/19  8:22 PM   Specimen: BLOOD LEFT ARM  Result Value Ref Range Status   Specimen Description BLOOD LEFT ARM  Final   Special Requests   Final    BOTTLES DRAWN AEROBIC ONLY Blood Culture results may not be optimal due to an excessive volume of blood received in culture bottles   Culture   Final    NO GROWTH 3 DAYS Performed at [facility], [address], [place], [place] [address]    Report Status PENDING  Incomplete  SARS CORONAVIRUS 2 (TAT 6-24 HRS) Nasopharyngeal  Nasopharyngeal Swab     Status: None   Collection Time: 10/07/19 12:10 AM   Specimen: Nasopharyngeal Swab  Result Value Ref Range Status   SARS Coronavirus 2 NEGATIVE NEGATIVE Final  Comment: (NOTE) SARS-CoV-2 target nucleic acids are NOT DETECTED. The SARS-CoV-2 RNA is generally detectable in upper and lower respiratory specimens during the acute phase of infection. Negative results do not preclude SARS-CoV-2 infection, do not rule out co-infections with other pathogens, and should not be used as the sole basis for treatment or other patient management decisions. Negative results must be combined with clinical observations, patient history, and epidemiological information. The expected result is Negative. Fact Sheet for Patients: HairSlick.no Fact Sheet for Healthcare Providers: quierodirigir.com This test is not yet approved or cleared by the [place] FDA and  has been authorized for detection and/or diagnosis of SARS-CoV-2 by FDA under an Emergency Use Authorization (EUA). This EUA will remain  in effect (meaning this test can be used) for the duration of the COVID-19 declaration under Section 56 4(b)(1) of the Act, 21 U.S.C. section 360bbb-3(b)(1), unless the authorization is terminated or revoked sooner. Performed at [facility], [address] [place], [place] [address]   Aerobic/Anaerobic Culture (surgical/deep wound)     Status: None (Preliminary result)   Collection Time: 10/08/19  7:58 PM   Specimen: Abscess  Result Value Ref Range Status   Specimen Description ABSCESS ABDOMEN  Final   Special Requests NONE  Final   Gram Stain NO WBC SEEN NO ORGANISMS SEEN   Final   Culture   Final    NO GROWTH < 24 HOURS Performed at [facility], [address] [place], [place] [address]    Report Status PENDING  Incomplete     Labs: Basic Metabolic Panel: Recent Labs  Lab 10/06/19 2017  10/07/19 0406 10/09/19 0552  NA 137 138 136  K 3.4* 3.1* 4.2  CL 99 99 100  CO2 29 29 27   GLUCOSE 96 124* 105*  BUN <5* <5* 7  CREATININE 0.82 0.70 0.68  CALCIUM 8.5* 8.2* 8.6*   Liver Function Tests: Recent Labs  Lab 10/06/19 2017 10/07/19 0406  AST 56* 52*  ALT 57* 55*  ALKPHOS 63 58  BILITOT 0.8 1.0  PROT 6.2* 5.9*  ALBUMIN 3.0* 2.7*   No results for input(s): LIPASE, AMYLASE in the last 168 hours. No results for input(s): AMMONIA in the last 168 hours. CBC: Recent Labs  Lab 10/06/19 2017 10/06/19 2017 10/07/19 0406 10/07/19 0824 10/07/19 1907 10/08/19 0220 10/08/19 1340 10/09/19 0552  WBC 8.4  --  6.8  --   --  6.5  --  6.4  NEUTROABS 6.0  --   --   --   --   --   --   --   HGB 8.0*   < > 7.0*  --  7.9* 6.9* 10.3* 10.2*  HCT 23.8*   < > 21.2* 24.8* 23.9* 20.5* 30.2* 30.9*  MCV 91.5  --  92.2  --   --  91.1  --  93.4  PLT 398  --  385  --   --  413*  --  489*   < > = values in this interval not displayed.   Cardiac Enzymes: No results for input(s): CKTOTAL, CKMB, CKMBINDEX, TROPONINI in the last 168 hours. BNP: BNP (last 3 results) No results for input(s): BNP in the last 8760 hours.  ProBNP (last 3 results) No results for input(s): PROBNP in the last 8760 hours.  CBG: No results for input(s): GLUCAP in the last 168 hours.     Signed:  10/11/19  [facility] 10/09/2019, 12:55 PM

## 2019-10-09 NOTE — Discharge Instructions (Signed)
Seroma A seroma is a collection of fluid on the body that looks like swelling or a mass. Seromas form where tissue has been injured or cut. Seromas vary in size. Some are small and painless. Others may become large and cause pain or discomfort. Many seromas go away on their own as the fluid is naturally absorbed by the body, and some seromas need to be drained. What are the causes? Seromas form as the result of damage to tissue or the removal of tissue. This tissue damage may occur during surgery or because of an injury or trauma. When tissue is disrupted or removed, empty space is created. The body's natural defense system (immune system) causes fluid to enter the empty space and form a seroma. What are the signs or symptoms? Symptoms of this condition include:  Swelling at the site of a surgical cut (incision) or an injury.  Drainage of clear fluid at the surgery or injury site.  Discomfort or pain. How is this diagnosed? This condition is diagnosed based on your symptoms, your medical history, and a physical exam. During the exam, your health care provider will press on the seroma. You may also have tests, including:  Blood tests.  Imaging tests, such as an ultrasound or CT scan. How is this treated? Some seromas go away (resolve) on their own. Your health care provider may monitor you to make sure the seroma does not cause any complications. If your seroma does not resolve on its own, treatment may include:  Using a needle to drain the fluid from the seroma (needle aspiration).  Inserting a flexible tube (catheter) to drain the fluid.  Applying a bandage (dressing), such as an elastic bandage or binder.  Antibiotic medicines, if the seroma becomes infected. In rare cases, surgery may be done to remove the seroma and repair the area. Follow these instructions at home:   If you were prescribed an antibiotic medicine, take it as told by your health care provider. Do not stop taking  the antibiotic even if you start to feel better.  Return to your normal activities as told by your health care provider. Ask your health care provider what activities are safe for you.  Take over-the-counter and prescription medicines only as told by your health care provider.  Check your seroma every day for signs of infection. Check for: ? Redness or pain. ? Fluid or pus. ? More swelling. ? Warmth.  Keep all follow-up visits as told by your health care provider. This is important. Contact a health care provider if:  You have a fever.  You have redness or pain at the site of the seroma.  You have fluid or pus coming from the seroma.  Your seroma is more swollen or is getting bigger.  Your seroma is warm to the touch. This information is not intended to replace advice given to you by your health care provider. Make sure you discuss any questions you have with your health care provider. Document Revised: 08/16/2017 Document Reviewed: 06/15/2016 Elsevier Patient Education  2020 [employer]  

## 2019-10-11 LAB — CULTURE, BLOOD (ROUTINE X 2)
Culture: NO GROWTH
Culture: NO GROWTH

## 2019-10-13 LAB — AEROBIC/ANAEROBIC CULTURE W GRAM STAIN (SURGICAL/DEEP WOUND)
Culture: NO GROWTH
Gram Stain: NONE SEEN

## 2019-10-15 ENCOUNTER — Encounter: Payer: Self-pay | Admitting: Cardiology

## 2019-10-15 ENCOUNTER — Other Ambulatory Visit: Payer: Self-pay

## 2019-10-15 ENCOUNTER — Ambulatory Visit: Payer: BC Managed Care – PPO | Admitting: Cardiology

## 2019-10-15 VITALS — BP 130/98 | HR 119 | Temp 97.8°F | Ht 61.0 in | Wt 166.0 lb

## 2019-10-15 DIAGNOSIS — R0602 Shortness of breath: Secondary | ICD-10-CM | POA: Diagnosis not present

## 2019-10-15 DIAGNOSIS — Z09 Encounter for follow-up examination after completed treatment for conditions other than malignant neoplasm: Secondary | ICD-10-CM | POA: Diagnosis not present

## 2019-10-15 DIAGNOSIS — R000 Tachycardia, unspecified: Secondary | ICD-10-CM

## 2019-10-15 DIAGNOSIS — L02211 Cutaneous abscess of abdominal wall: Secondary | ICD-10-CM | POA: Diagnosis not present

## 2019-10-15 NOTE — Progress Notes (Signed)
Cardiology Office Note:    Date:  10/15/2019   ID:  Isabel Cole, DOB [birth year], MRN [id number]  PCP:  Patient, No Pcp Per  Cardiologist:  Buford Dresser, MD PhD  Referring MD: Dr. Ree Kida (inpatient attending)  Chief complaint: Post discharge follow up  History of Present Illness:    Isabel Cole is a 34 y.o. female with a hx of hypertension who is seen for post hospital follow up. She was discharged on 10/09/19. Full discharge summary and hospitalization reviewed. She was admitted for sepsis 2/2 abdominal wall seroma. She had liposuction/fat transplant about 1 week prior to admission (Dr. Penni Homans, plastic surgery, [facility]). She presented with fever and tachycardia, both improved with treatment.   She notices that she has fast heart rate, fatigue shortness of breath since her surgery. She required IV fluids initially, was on cipro, lovenox post procedure. Continued to feel worse. Felt dizzy, stomach swelling, went to ER. Was anemic, septic. CTPE negative. Admitted for further management.  No longer has dizzyness, does still have mild shortness of breath. Has a pulse ox at home, has been checking religiously. At rest, HR 108-110, O2 96-100%.  Normal resting HR 80-90 for her. She is just very tired, worn out. Doesn't feel like herself, like she was prior to surgery.  We discussed sinus tachycardia at length today. She has a history of prior intermittent elevated heart rates. We discussed how surgery, sepsis, and anemia impact the heart rate as well.  Denies chest pain, PND, orthopnea, LE edema or unexpected weight gain. No syncope.  Past Medical History:  Diagnosis Date  . Gestational hypertension   . Hypertension     Past Surgical History:  Procedure Laterality Date  . CESAREAN SECTION    . LIPOSUCTION      Current Medications: Current Outpatient Medications on File Prior to Visit  Medication Sig  . ondansetron (ZOFRAN) 8 MG tablet Take 1 tablet (8 mg total) by  mouth every 6 (six) hours as needed for nausea or vomiting.  Marland Kitchen oxyCODONE-acetaminophen (PERCOCET) 10-325 MG tablet Take 1 tablet by mouth every 4 (four) hours as needed.   No current facility-administered medications on file prior to visit.     Allergies:   Patient has no known allergies.   Social History   Tobacco Use  . Smoking status: Never Smoker  . Smokeless tobacco: Never Used  Substance Use Topics  . Alcohol use: No  . Drug use: No    Family History: The patient's family history includes Cancer in her mother.  ROS:   Please see the history of present illness.  Additional pertinent ROS negative except as documented.   EKGs/Labs/Other Studies Reviewed:    The following studies were reviewed today: No cardiac studies available  EKG:  EKG is ordered today.  The ekg ordered today demonstrates sinus tachycardia at 103 bpm  Recent Labs: 10/07/2019: ALT 55 10/09/2019: BUN 7; Creatinine, Ser 0.68; Hemoglobin 10.2; Platelets 489; Potassium 4.2; Sodium 136  Recent Lipid Panel No results found for: CHOL, TRIG, HDL, CHOLHDL, VLDL, LDLCALC, LDLDIRECT  Physical Exam:    VS:  BP (!) 130/98   Pulse (!) 119   Temp 97.8 F (36.6 C)   Ht 5\' 1"  (1.549 m)   Wt 166 lb (75.3 kg)   SpO2 98%   BMI 31.37 kg/m     Wt Readings from Last 3 Encounters:  10/15/19 166 lb (75.3 kg)  10/06/19 178 lb (80.7 kg)  05/01/18 172 lb 6.4 oz (78.2 kg)  GEN: Well nourished, well developed in no acute distress HEENT: Normal, moist mucous membranes NECK: No JVD CARDIAC: regular rhythm, normal S1 and S2, no rubs or gallops. No murmur. VASCULAR: Radial and DP pulses 2+ bilaterally. No carotid bruits RESPIRATORY:  Clear to auscultation without rales, wheezing or rhonchi  ABDOMEN: JP drain in place, abdominal binder in place MUSCULOSKELETAL:  Ambulates independently SKIN: Warm and dry, no edema NEUROLOGIC:  Alert and oriented x 3. No focal neuro deficits noted. PSYCHIATRIC:  Normal affect    ASSESSMENT:    1. Tachycardia   2. Hospital discharge follow-up   3. Shortness of breath   4. Abdominal wall abscess    PLAN:    Recent hospitalization for sepsis, anemia after elective liposuction surgery and fat transplant -we reviewed together the series of events and findings that led up to her recent hospitalization -she is still not fully recovered, continues to have fatigue, shortness of breath -with sinus tachycardia and shortness of breath, PE ruled out with CT during hospitalization -we discussed sinus tachycardia today at length, what drives it, etc -given the severity of her recent hospitalization--and she still has a JP drain in place for her abdominal wall abscess--I suspect that her sinus tach is driven by her recent comorbid conditions -oxygen has been normal -instructed on how to monitor HR -instructed on red flag warning signs that need immediate medical attention  Plan for follow up: 4-6 weeks or sooner PRN  Total time of encounter: 45 minutes total time of encounter, including 37 minutes spent in face-to-face patient care. This time includes coordination of care and counseling regarding review of her recent hospitalization, causes of sinus tachycardia, what to watch for. Remainder of non-face-to-face time involved reviewing chart documents/testing relevant to the patient encounter and documentation in the medical record.  Jodelle Red, MD, PhD [place]  [facility]   Medication Adjustments/Labs and Tests Ordered: Current medicines are reviewed at length with the patient today.  Concerns regarding medicines are outlined above.  Orders Placed This Encounter  Procedures  . EKG 12-Lead   No orders of the defined types were placed in this encounter.   Patient Instructions  Medication Instructions:  Your Physician recommend you continue on your current medication as directed.    *If you need a refill on your cardiac medications before your  next appointment, please call your pharmacy*  Lab Work: None  Testing/Procedures: None  Follow-Up: At [facility], you and your health needs are our priority.  As part of our continuing mission to provide you with exceptional heart care, we have created designated Provider Care Teams.  These Care Teams include your primary Cardiologist (physician) and Advanced Practice Providers (APPs -  Physician Assistants and Nurse Practitioners) who all work together to provide you with the care you need, when you need it.  Your next appointment:   4-6 week(s)  The format for your next appointment:   In Person  Provider:   Jodelle Red, MD      Signed, Jodelle Red, MD PhD 10/15/2019 7:15 PM    [facility]

## 2019-10-15 NOTE — Patient Instructions (Signed)
Medication Instructions:  Your Physician recommend you continue on your current medication as directed.    *If you need a refill on your cardiac medications before your next appointment, please call your pharmacy*  Lab Work: None  Testing/Procedures: None  Follow-Up: At [facility], you and your health needs are our priority.  As part of our continuing mission to provide you with exceptional heart care, we have created designated Provider Care Teams.  These Care Teams include your primary Cardiologist (physician) and Advanced Practice Providers (APPs -  Physician Assistants and Nurse Practitioners) who all work together to provide you with the care you need, when you need it.  Your next appointment:   4-6 week(s)  The format for your next appointment:   In Person  Provider:   Jodelle Red, MD

## 2019-10-20 ENCOUNTER — Ambulatory Visit
Admission: RE | Admit: 2019-10-20 | Discharge: 2019-10-20 | Disposition: A | Discharge status: Home / Self Care | Payer: BC Managed Care – PPO | Source: Ambulatory Visit | Attending: Student | Admitting: Student

## 2019-10-20 ENCOUNTER — Encounter: Payer: Self-pay | Admitting: *Deleted

## 2019-10-20 ENCOUNTER — Ambulatory Visit
Admission: RE | Admit: 2019-10-20 | Discharge: 2019-10-20 | Disposition: A | Discharge status: Home / Self Care | Payer: BC Managed Care – PPO | Source: Ambulatory Visit | Attending: Plastic Surgery | Admitting: Plastic Surgery

## 2019-10-20 DIAGNOSIS — L02211 Cutaneous abscess of abdominal wall: Secondary | ICD-10-CM

## 2019-10-20 HISTORY — PX: IR RADIOLOGIST EVAL & MGMT: IMG5224

## 2019-10-20 MED ORDER — IOPAMIDOL (ISOVUE-300) INJECTION 61%
100.0000 mL | Freq: Once | INTRAVENOUS | Status: AC | PRN
  Administered 2019-10-20: 100 mL via INTRAVENOUS

## 2019-10-20 NOTE — Progress Notes (Signed)
Referring Physician(s): Dr. Marla Roe  Chief Complaint: The patient is seen in follow up today s/p intra abdominal abscess drain in a seroma s/p lioposuction.  History of present illness:  History of seroma s/p liposuction at OSH. IR placed an abscess drain on 1.21.21. Per Patient output has been 2-3 ml for 5 days. Patient states that she is not flushing as the flush was leaking around the exit site. Patient presents for abscess drain evaluation and possible removal.  Past Medical History:  Diagnosis Date  . Gestational hypertension   . Hypertension     Past Surgical History:  Procedure Laterality Date  . CESAREAN SECTION    . IR RADIOLOGIST EVAL & MGMT  10/20/2019  . LIPOSUCTION      Allergies: Patient has no known allergies.  Medications: Prior to Admission medications   Not on File     Family History  Problem Relation Age of Onset  . Cancer Mother     Social History   Socioeconomic History  . Marital status: Single    Spouse name: Not on file  . Number of children: Not on file  . Years of education: Not on file  . Highest education level: Not on file  Occupational History  . Not on file  Tobacco Use  . Smoking status: Never Smoker  . Smokeless tobacco: Never Used  Substance and Sexual Activity  . Alcohol use: No  . Drug use: No  . Sexual activity: Not on file  Other Topics Concern  . Not on file  Social History Narrative  . Not on file   Social Determinants of Health   Financial Resource Strain:   . Difficulty of Paying Living Expenses: Not on file  Food Insecurity:   . Worried About Charity fundraiser in the Last Year: Not on file  . Ran Out of Food in the Last Year: Not on file  Transportation Needs:   . Lack of Transportation (Medical): Not on file  . Lack of Transportation (Non-Medical): Not on file  Physical Activity:   . Days of Exercise per Week: Not on file  . Minutes of Exercise per Session: Not on file  Stress:   . Feeling of  Stress : Not on file  Social Connections:   . Frequency of Communication with Friends and Family: Not on file  . Frequency of Social Gatherings with Friends and Family: Not on file  . Attends Religious Services: Not on file  . Active Member of Clubs or Organizations: Not on file  . Attends Archivist Meetings: Not on file  . Marital Status: Not on file     Vital Signs: There were no vitals taken for this visit.  Physical Exam Vitals and nursing note reviewed.  Constitutional:      Appearance: She is well-developed.  HENT:     Head: Normocephalic and atraumatic.  Eyes:     Conjunctiva/sclera: Conjunctivae normal.  Pulmonary:     Effort: Pulmonary effort is normal.  Musculoskeletal:        General: Normal range of motion.     Cervical back: Normal range of motion.  Skin:    General: Skin is warm.     Comments: drain site is unremarkable with no erythema, tenderness or drainage noted. Suture and stat lock in place. Dressing is clean dry and intact. 1 ml of  flush colored fluid noted in the line of  bulb suction device. Drain removed at bedside intact. MD assisted at bedside  due to retained suture from locking mechanism.    Neurological:     Mental Status: She is alert and oriented to person, place, and time.     Imaging: IR Radiologist Eval & Mgmt  Result Date: 10/20/2019 Please refer to notes tab for details about interventional procedure. (Op Note)   Labs:  CBC: Recent Labs    10/06/19 2017 10/06/19 2017 10/07/19 0406 10/07/19 0824 10/07/19 1907 10/08/19 0220 10/08/19 1340 10/09/19 0552  WBC 8.4  --  6.8  --   --  6.5  --  6.4  HGB 8.0*   < > 7.0*   < > 7.9* 6.9* 10.3* 10.2*  HCT 23.8*   < > 21.2*   < > 23.9* 20.5* 30.2* 30.9*  PLT 398  --  385  --   --  413*  --  489*   < > = values in this interval not displayed.    COAGS: Recent Labs    10/06/19 2017  INR 1.0    BMP: Recent Labs    10/06/19 2017 10/07/19 0406 10/09/19 0552  NA 137  138 136  K 3.4* 3.1* 4.2  CL 99 99 100  CO2 29 29 27   GLUCOSE 96 124* 105*  BUN <5* <5* 7  CALCIUM 8.5* 8.2* 8.6*  CREATININE 0.82 0.70 0.68  GFRNONAA >60 >60 >60  GFRAA >60 >60 >60    LIVER FUNCTION TESTS: Recent Labs    10/06/19 2017 10/07/19 0406  BILITOT 0.8 1.0  AST 56* 52*  ALT 57* 55*  ALKPHOS 63 58  PROT 6.2* 5.9*  ALBUMIN 3.0* 2.7*    Assessment:  34 y.o, female outpatient. History of seroma s/p liposuction at OSH. IR placed an abscess drain on 1.21.21. Per Patient output has been 2-3 ml for 5 days. Patient states that she is not flushing as the flush was leaking around the exit site.   Pertinent Imaging 2.2.21 - CT abd shows resolving seroma  Pertinent IR History 1.21.21 - Placement abscess drain - intra abdominal  Pertinent Allergies NKDA  No recent labs and medications are within acceptable parameters. Patient is afebrile.  Patient to stable from IR perscpective s/p drain removal. Further plans per primary team.  Signed: 03-01-1972, NP 10/20/2019, 12:27 PM   Please refer to Dr. 12/18/2019 attestation of this note for management and plan.         Patient ID: Isabel Cole, female   DOB: October 15, 1985, 34 y.o.   MRN: [id number]

## 2019-10-22 ENCOUNTER — Other Ambulatory Visit: Payer: BC Managed Care – PPO

## 2019-11-26 ENCOUNTER — Telehealth: Payer: BC Managed Care – PPO | Admitting: Cardiology

## 2019-12-02 ENCOUNTER — Emergency Department
Admission: EM | Admit: 2019-12-02 | Discharge: 2019-12-02 | Disposition: A | Discharge status: Home / Self Care | Payer: BC Managed Care – PPO | Source: Home / Self Care | Attending: Family Medicine | Admitting: Family Medicine

## 2019-12-02 ENCOUNTER — Other Ambulatory Visit: Payer: Self-pay

## 2019-12-02 DIAGNOSIS — R197 Diarrhea, unspecified: Secondary | ICD-10-CM

## 2019-12-02 DIAGNOSIS — R112 Nausea with vomiting, unspecified: Secondary | ICD-10-CM | POA: Diagnosis not present

## 2019-12-02 MED ORDER — ONDANSETRON 4 MG PO TBDP
4.0000 mg | ORAL_TABLET | Freq: Once | ORAL | Status: AC
  Administered 2019-12-02: 4 mg via ORAL

## 2019-12-02 MED ORDER — ONDANSETRON 4 MG PO TBDP: ORAL_TABLET | ORAL | 0 refills | Status: DC

## 2019-12-02 NOTE — Discharge Instructions (Addendum)
Begin Pedialye for about 12 to 18 hours until diarrhea stops, then switch to clear liquids (apple juice, clear grape juice, Jello, etc) for about 12 to 18 hours.  When improved, advance to a [employer] (Bananas, Rice, Applesauce, Toast). Then gradually resume a regular diet when tolerated.  Avoid milk products until well.  When stools become more formed, may take Imodium (loperamide) once or twice daily to decrease stool frequency.   If symptoms become significantly worse during the night or over the weekend, proceed to the local emergency room.  Isolate yourself until COVID-19 test result is available.   If your COVID19 test is positive, then you are infected with the novel coronavirus and could give the virus to others.  Please continue isolation at home for at least 10 days since the start of your symptoms.  Once you complete your 10 day quarantine, you may return to normal activities as long as you've not had a fever for over 24 hours (without taking fever reducing medicine) and your symptoms are improving. Please continue good preventive care measures, including:  frequent hand-washing, avoid touching your face, cover coughs/sneezes, stay out of crowds and keep a 6 foot distance from others.  Go to the nearest hospital emergency room if fever/cough/breathlessness are severe or illness seems like a threat to life.

## 2019-12-02 NOTE — ED Provider Notes (Signed)
[facility]    CSN: [id number] Arrival date & time: 12/02/19  1537      History   Chief Complaint Chief Complaint  Patient presents with  . Emesis  . Diarrhea    HPI Isabel Cole is a 34 y.o. female.   Yesterday about midday she developed nausea/vomiting, followed later by watery diarrhea.  Her vomiting resolved, although she continues to have nausea and diarrhea.  She complains of abdominal bloating, chills, fatigue, and upper body myalgias.  She denies fever, urinary, and respiratory symptoms.   She denies chest tightness, shortness of breath, and changes in taste/smell.    The history is provided by the patient.  Emesis Severity:  Moderate Duration:  1 day Timing:  Intermittent Quality:  Stomach contents Able to tolerate:  Liquids Progression:  Resolved Chronicity:  New Recent urination:  Normal Relieved by:  Nothing Worsened by:  Nothing Ineffective treatments:  None tried Associated symptoms: chills, diarrhea and myalgias   Associated symptoms: no abdominal pain, no arthralgias, no cough, no fever, no headaches, no sore throat and no URI   Diarrhea:    Quality:  Watery   Number of occurrences:  Multiple   Severity:  Moderate   Duration:  1 day   Timing:  Intermittent   Progression:  Improving Risk factors: not pregnant, no prior abdominal surgery, no sick contacts, no suspect food intake and no travel to endemic areas     Past Medical History:  Diagnosis Date  . Gestational hypertension   . Hypertension     Patient Active Problem List   Diagnosis Date Noted  . Abdominal wall abscess 10/08/2019  . Acute blood loss anemia 10/08/2019  . Sepsis ([place]) 10/08/2019  . Hypokalemia 10/08/2019  . Postoperative anemia 10/07/2019  . Abnormal liver function 10/07/2019    Past Surgical History:  Procedure Laterality Date  . CESAREAN SECTION    . IR RADIOLOGIST EVAL & MGMT  10/20/2019  . LIPOSUCTION      OB History    Gravida  3   Para  1     Term  0   Preterm  1   AB  1   Living  1     SAB  1   TAB      Ectopic      Multiple      Live Births               Home Medications    Prior to Admission medications   Medication Sig Start Date End Date Taking? Authorizing Provider  ondansetron (ZOFRAN ODT) 4 MG disintegrating tablet Take one tab by mouth Q6hr prn nausea.  Dissolve under tongue. 12/02/19   Kandra Nicolas, MD    Family History Family History  Problem Relation Age of Onset  . Hypertension Mother     Social History Social History   Tobacco Use  . Smoking status: Never Smoker  . Smokeless tobacco: Never Used  Substance Use Topics  . Alcohol use: No  . Drug use: No     Allergies   Patient has no known allergies.   Review of Systems Review of Systems  Constitutional: Positive for chills. Negative for fever.  HENT: Negative for sore throat.   Respiratory: Negative for cough.   Gastrointestinal: Positive for diarrhea and vomiting. Negative for abdominal pain.  Musculoskeletal: Positive for myalgias. Negative for arthralgias.  Neurological: Negative for headaches.     Physical Exam Triage Vital Signs ED Triage Vitals  Enc Vitals Group     BP 12/02/19 1552 (!) 148/95     Pulse Rate 12/02/19 1552 88     Resp 12/02/19 1552 18     Temp 12/02/19 1552 98.5 F (36.9 C)     Temp Source 12/02/19 1552 Oral     SpO2 12/02/19 1552 98 %     Weight --      Height --      Head Circumference --      Peak Flow --      Pain Score 12/02/19 1548 1     Pain Loc --      Pain Edu? --      Excl. in GC? --    No data found.  Updated Vital Signs BP (!) 148/95 (BP Location: Right Arm)   Pulse 88   Temp 98.5 F (36.9 C) (Oral)   Resp 18   SpO2 98%   Visual Acuity Right Eye Distance:   Left Eye Distance:   Bilateral Distance:    Right Eye Near:   Left Eye Near:    Bilateral Near:     Physical Exam Nursing notes and Vital Signs reviewed. Appearance:  Patient appears stated age,  and in no acute distress Eyes:  Pupils are equal, round, and reactive to light and accomodation.  Extraocular movement is intact.  Conjunctivae are not inflamed  Ears:  Canals normal.  Tympanic membranes normal.  Nose:  Normal turbinates.  No sinus tenderness.   Pharynx:  Normal; moist mucous membranes. Neck:  Supple.  No adenopathy.  Lungs:  Clear to auscultation.  Breath sounds are equal.  Moving air well. Heart:  Regular rate and rhythm without murmurs, rubs, or gallops.  Abdomen:  Nontender without masses or hepatosplenomegaly.  Bowel sounds are present.  No CVA or flank tenderness.  Extremities:  No edema.  Skin:  No rash present.   UC Treatments / Results  Labs (all labs ordered are listed, but only abnormal results are displayed) Labs Reviewed  NOVEL CORONAVIRUS, NAA    EKG   Radiology No results found.  Procedures Procedures (including critical care time)  Medications Ordered in UC Medications  ondansetron (ZOFRAN-ODT) disintegrating tablet 4 mg (4 mg Oral Given 12/02/19 1559)    Initial Impression / Assessment and Plan / UC Course  I have reviewed the triage vital signs and the nursing notes.  Pertinent labs & imaging results that were available during my care of the patient were reviewed by me and considered in my medical decision making (see chart for details).    Administered Zofran ODT 4mg  PO; given Rx for same.   Benign exam; suspect viral gastroenteritis. Treat symptomatically for now. COVID19 send out.   Final Clinical Impressions(s) / UC Diagnoses   Final diagnoses:  Nausea and vomiting, intractability of vomiting not specified, unspecified vomiting type  Nausea vomiting and diarrhea     Discharge Instructions     Begin Pedialye for about 12 to 18 hours until diarrhea stops, then switch to clear liquids (apple juice, clear grape juice, Jello, etc) for about 12 to 18 hours.  When improved, advance to a (Bananas, Rice, Applesauce, Toast).  Then gradually resume a regular diet when tolerated.  Avoid milk products until well.  When stools become more formed, may take Imodium (loperamide) once or twice daily to decrease stool frequency.   If symptoms become significantly worse during the night or over the weekend, proceed to the local emergency room.  Isolate  yourself until COVID-19 test result is available.   If your COVID19 test is positive, then you are infected with the novel coronavirus and could give the virus to others.  Please continue isolation at home for at least 10 days since the start of your symptoms.  Once you complete your 10 day quarantine, you may return to normal activities as long as you've not had a fever for over 24 hours (without taking fever reducing medicine) and your symptoms are improving. Please continue good preventive care measures, including:  frequent hand-washing, avoid touching your face, cover coughs/sneezes, stay out of crowds and keep a 6 foot distance from others.  Go to the nearest hospital emergency room if fever/cough/breathlessness are severe or illness seems like a threat to life.    ED Prescriptions    Medication Sig Dispense Auth. Provider   ondansetron (ZOFRAN ODT) 4 MG disintegrating tablet Take one tab by mouth Q6hr prn nausea.  Dissolve under tongue. 12 tablet Lattie Haw, MD        Lattie Haw, MD 12/02/19 [phone]

## 2019-12-02 NOTE — ED Triage Notes (Signed)
Patient presents to Urgent Care with complaints of n/v/d  since yesterday. 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 Pt cannot think of a specific meal that would have caused her sx. Pt denies other sx of illness or close contacts w/ the same sx.

## 2019-12-03 LAB — NOVEL CORONAVIRUS, NAA: SARS-CoV-2, NAA: NOT DETECTED

## 2020-02-08 ENCOUNTER — Inpatient Hospital Stay (HOSPITAL_COMMUNITY)
Admission: AD | Admit: 2020-02-08 | Discharge: 2020-02-09 | Disposition: A | Discharge status: Home / Self Care | Attending: Obstetrics & Gynecology | Admitting: Obstetrics & Gynecology

## 2020-02-08 ENCOUNTER — Other Ambulatory Visit: Payer: Self-pay

## 2020-02-08 ENCOUNTER — Encounter (HOSPITAL_COMMUNITY): Payer: Self-pay | Admitting: Obstetrics & Gynecology

## 2020-02-08 DIAGNOSIS — Z3A01 Less than 8 weeks gestation of pregnancy: Secondary | ICD-10-CM | POA: Diagnosis not present

## 2020-02-08 DIAGNOSIS — O10911 Unspecified pre-existing hypertension complicating pregnancy, first trimester: Secondary | ICD-10-CM | POA: Insufficient documentation

## 2020-02-08 DIAGNOSIS — O10919 Unspecified pre-existing hypertension complicating pregnancy, unspecified trimester: Secondary | ICD-10-CM

## 2020-02-08 DIAGNOSIS — Z8249 Family history of ischemic heart disease and other diseases of the circulatory system: Secondary | ICD-10-CM | POA: Diagnosis not present

## 2020-02-08 DIAGNOSIS — Z8759 Personal history of other complications of pregnancy, childbirth and the puerperium: Secondary | ICD-10-CM | POA: Diagnosis not present

## 2020-02-08 LAB — COMPREHENSIVE METABOLIC PANEL
ALT: 20 U/L (ref 0–44)
AST: 19 U/L (ref 15–41)
Albumin: 3.9 g/dL (ref 3.5–5.0)
Alkaline Phosphatase: 48 U/L (ref 38–126)
Anion gap: 11 (ref 5–15)
BUN: 11 mg/dL (ref 6–20)
CO2: 24 mmol/L (ref 22–32)
Calcium: 9.3 mg/dL (ref 8.9–10.3)
Chloride: 104 mmol/L (ref 98–111)
Creatinine, Ser: 0.67 mg/dL (ref 0.44–1.00)
GFR calc Af Amer: >60 mL/min (ref >60)
GFR calc non Af Amer: >60 mL/min (ref >60)
Glucose, Bld: 129 mg/dL — ABNORMAL HIGH (ref 70–99)
Potassium: 3.7 mmol/L (ref 3.5–5.1)
Sodium: 139 mmol/L (ref 135–145)
Total Bilirubin: 0.4 mg/dL (ref 0.3–1.2)
Total Protein: 6.8 g/dL (ref 6.5–8.1)

## 2020-02-08 LAB — URINALYSIS, ROUTINE W REFLEX MICROSCOPIC
Bilirubin Urine: NEGATIVE
Glucose, UA: NEGATIVE mg/dL
Ketones, ur: NEGATIVE mg/dL
Nitrite: NEGATIVE
Protein, ur: NEGATIVE mg/dL
Specific Gravity, Urine: 1.011 (ref 1.005–1.030)
pH: 6 (ref 5.0–8.0)

## 2020-02-08 LAB — CBC
HCT: 36.7 % (ref 36.0–46.0)
Hemoglobin: 12.5 g/dL (ref 12.0–15.0)
MCH: 30.3 pg (ref 26.0–34.0)
MCHC: 34.1 g/dL (ref 30.0–36.0)
MCV: 89.1 fL (ref 80.0–100.0)
Platelets: 289 10*3/uL (ref 150–400)
RBC: 4.12 MIL/uL (ref 3.87–5.11)
RDW: 12.2 % (ref 11.5–15.5)
WBC: 9.7 10*3/uL (ref 4.0–10.5)
nRBC: 0 % (ref 0.0–0.2)

## 2020-02-08 LAB — PROTEIN / CREATININE RATIO, URINE
Creatinine, Urine: 46.67 mg/dL
Total Protein, Urine: <6 mg/dL

## 2020-02-08 LAB — POCT PREGNANCY, URINE: Preg Test, Ur: POSITIVE — AB

## 2020-02-08 MED ORDER — LABETALOL HCL 100 MG PO TABS
200.0000 mg | ORAL_TABLET | Freq: Once | ORAL | Status: AC
  Administered 2020-02-08: 200 mg via ORAL
  Filled 2020-02-08: qty 2

## 2020-02-08 NOTE — MAU Note (Signed)
PT SAYS SHE DID HPT ON SAT/ SUN - POSITIVE . HAS HX HIGH BP. CALLED DR OFFICE IN [place]- TO MAKE APPOINTMENT 6-15.  BP AT 140-160/100-110. TODAY 162/112. HAS H/A - STARTED  TODAY . BEFORE PREG  HAS HIGH BP - WAS ON BP MED - DR STOPPED MED IN FEB .

## 2020-02-08 NOTE — MAU Provider Note (Addendum)
History     CSN: [id number]  Arrival date and time: 02/08/20 2056   First Provider Initiated Contact with Patient 02/08/20 2309      Chief Complaint  Patient presents with  . Hypertension   HPI Ms.  Isabel Cole is a 34 y.o. year old [id number] female at [redacted]w[redacted]d weeks gestation by LMP who presents to MAU reporting she has chronic high blood pressure and had 2 pregnancy tests that were positive at home on May 22 and Feb 07, 2020.  She reports that she had a headache today while at work 650 mg of Tylenol around noon.  Tylenol did not relieve her headache.  She was taking Atenolol/Chlorthaladone 50/25 daily. Her provider took her off of it in February 2021.  She has an appointment scheduled with the [facility] On June 15, but desires to receive prenatal care in [place] where she resides.  She reports that she has a high risk pregnancy history and notes that she will have high risk pregnancy at this time.  She wishes to find a high risk provider in [place].  She reports that her blood pressures at home range from 140-160/100-110.  She feels that that is high and she is worried that she needed to be started on medication.  When she called [facility] doctors' office today they told her to come to the emergency room.  So she came to the MAU to be evaluated.  She reports that she did labetalol in her prior pregnancy with no problems.  OB History    Gravida  4   Para  2   Term  0   Preterm  2   AB  1   Living  1     SAB  1   TAB      Ectopic      Multiple      Live Births  2           Past Medical History:  Diagnosis Date  . Gestational hypertension   . Hypertension     Past Surgical History:  Procedure Laterality Date  . CESAREAN SECTION    . IR RADIOLOGIST EVAL & MGMT  10/20/2019  . LIPOSUCTION      Family History  Problem Relation Age of Onset  . Hypertension Mother     Social History   Tobacco Use  . Smoking status: Never Smoker  . Smokeless  tobacco: Never Used  Substance Use Topics  . Alcohol use: No  . Drug use: No    Allergies: No Known Allergies  No medications prior to admission.    Review of Systems  Constitutional: Negative.   HENT: Negative.   Eyes: Negative.   Respiratory: Negative.   Cardiovascular: Negative.   Gastrointestinal: Negative.   Genitourinary: Negative.   Musculoskeletal: Negative.   Skin: Negative.   Neurological: Positive for headaches (tried Tylenol 650 mg at noon -- no relief).  Endo/Heme/Allergies: Negative.   Psychiatric/Behavioral: Negative.     Physical Exam   Patient Vitals for the past 24 hrs:  BP Temp Temp src Pulse Resp SpO2 Height Weight  02/09/20 0001 129/87 - - 83 15 - - -  02/08/20 2346 (!) 134/92 - - 86 - - - -  02/08/20 2331 (!) 153/113 - - 91 - - - -  02/08/20 2315 (!) 143/95 - - 95 - - - -  02/08/20 2300 (!) 156/100 - - 89 - - - -  02/08/20 2245 (!) 148/100 - -  95 - - - -  02/08/20 2231 (!) 154/105 - - 89 - - - -  02/08/20 2215 (!) 154/109 - - 93 - - - -  02/08/20 2211 (!) 145/112 - - 96 - - - -  02/08/20 2145 (!) 157/105 - - 91 - - - -  02/08/20 2142 (!) 154/106 - - 92 - - - -  02/08/20 2135 (!) 156/109 - - 92 17 100 % - -  02/08/20 2111 (!) 162/112 99 F (37.2 C) Oral 94 18 - 5\' 2"  (1.575 m) 75.1 kg     Physical Exam Constitutional:      Appearance: Normal appearance. She is normal weight.  Genitourinary:     Genitourinary Comments: Pelvic deferred  HENT:     Head: Normocephalic and atraumatic.     Nose: Nose normal.  Eyes:     Pupils: Pupils are equal, round, and reactive to light.  Cardiovascular:     Rate and Rhythm: Normal rate.  Pulmonary:     Effort: Pulmonary effort is normal.  Abdominal:     Palpations: Abdomen is soft.  Musculoskeletal:        General: Normal range of motion.     Cervical back: Normal range of motion.  Neurological:     Mental Status: She is alert.  Skin:    General: Skin is warm and dry.  Psychiatric:        Mood  and Affect: Mood normal.        Behavior: Behavior normal.        Thought Content: Thought content normal.        Judgment: Judgment normal.  Vitals and nursing note reviewed.    MAU Course  Procedures  MDM CCUA CBC CMP P/C Ratio Serial BP's  Severe range BPs >> Labetalol 200 mg po -- BP improved  Results for orders placed or performed during the hospital encounter of 02/08/20 (from the past 24 hour(s))  Urinalysis, Routine w reflex microscopic     Status: Abnormal   Collection Time: 02/08/20  9:25 PM  Result Value Ref Range   Color, Urine STRAW (A) YELLOW   APPearance CLEAR CLEAR   Specific Gravity, Urine 1.011 1.005 - 1.030   pH 6.0 5.0 - 8.0   Glucose, UA NEGATIVE NEGATIVE mg/dL   Hgb urine dipstick SMALL (A) NEGATIVE   Bilirubin Urine NEGATIVE NEGATIVE   Ketones, ur NEGATIVE NEGATIVE mg/dL   Protein, ur NEGATIVE NEGATIVE mg/dL   Nitrite NEGATIVE NEGATIVE   Leukocytes,Ua MODERATE (A) NEGATIVE   RBC / HPF 0-5 0 - 5 RBC/hpf   WBC, UA 0-5 0 - 5 WBC/hpf   Bacteria, UA RARE (A) NONE SEEN   Squamous Epithelial / LPF 6-10 0 - 5  Pregnancy, urine POC     Status: Abnormal   Collection Time: 02/08/20  9:29 PM  Result Value Ref Range   Preg Test, Ur POSITIVE (A) NEGATIVE  Protein / creatinine ratio, urine     Status: None   Collection Time: 02/08/20  9:40 PM  Result Value Ref Range   Creatinine, Urine 46.67 mg/dL   Total Protein, Urine <6 mg/dL   Protein Creatinine Ratio RESULT BELOW REPORTABLE RANGE,  UNABLE TO CALCULATE.     0.00 - 0.15 mg/mg[Cre]  CBC     Status: None   Collection Time: 02/08/20  9:52 PM  Result Value Ref Range   WBC 9.7 4.0 - 10.5 K/uL   RBC 4.12 3.87 - 5.11 MIL/uL  Hemoglobin 12.5 12.0 - 15.0 g/dL   HCT 36.7 36.0 - 46.0 %   MCV 89.1 80.0 - 100.0 fL   MCH 30.3 26.0 - 34.0 pg   MCHC 34.1 30.0 - 36.0 g/dL   RDW 12.2 11.5 - 15.5 %   Platelets 289 150 - 400 K/uL   nRBC 0.0 0.0 - 0.2 %  Comprehensive metabolic panel     Status: Abnormal    Collection Time: 02/08/20  9:52 PM  Result Value Ref Range   Sodium 139 135 - 145 mmol/L   Potassium 3.7 3.5 - 5.1 mmol/L   Chloride 104 98 - 111 mmol/L   CO2 24 22 - 32 mmol/L   Glucose, Bld 129 (H) 70 - 99 mg/dL   BUN 11 6 - 20 mg/dL   Creatinine, Ser 0.67 0.44 - 1.00 mg/dL   Calcium 9.3 8.9 - 10.3 mg/dL   Total Protein 6.8 6.5 - 8.1 g/dL   Albumin 3.9 3.5 - 5.0 g/dL   AST 19 15 - 41 U/L   ALT 20 0 - 44 U/L   Alkaline Phosphatase 48 38 - 126 U/L   Total Bilirubin 0.4 0.3 - 1.2 mg/dL   GFR calc non Af Amer >60 >60 mL/min   GFR calc Af Amer >60 >60 mL/min   Anion gap 11 5 - 15      Assessment and Plan  Chronic hypertension affecting pregnancy - Plan: Discharge patient - Rx for Labetalol 100 mg BID - Information provided on HTN in pregnancy - List of safe meds in pregnancy given - Information for [facility] and [facility] given per pt request - Advised to call one of the offices provided to establish [facility] ASAP - Patient verbalized an understanding of the plan of care and agrees.     Laury Deep, MSN, CNM 02/08/2020, 11:10 PM

## 2020-02-09 DIAGNOSIS — O10911 Unspecified pre-existing hypertension complicating pregnancy, first trimester: Secondary | ICD-10-CM

## 2020-02-09 DIAGNOSIS — Z3A01 Less than 8 weeks gestation of pregnancy: Secondary | ICD-10-CM

## 2020-02-09 MED ORDER — LABETALOL HCL 100 MG PO TABS: 100.0000 mg | ORAL_TABLET | Freq: Two times a day (BID) | ORAL | 6 refills | Status: AC

## 2020-02-09 NOTE — Discharge Instructions (Signed)
Continue to check your blood pressure since now you are starting on medication again. Please return to MAU for blood pressure of greater than 160/110.  Safe Medications in Pregnancy   Acne: Benzoyl Peroxide Salicylic Acid  Backache/Headache: Tylenol: 2 regular strength every 4 hours OR              2 Extra strength every 6 hours  Colds/Coughs/Allergies: Benadryl (alcohol free) 25 mg every 6 hours as needed Breath right strips Claritin Cepacol throat lozenges Chloraseptic throat spray Cold-Eeze- up to three times per day Cough drops, alcohol free Flonase (by prescription only) Guaifenesin Mucinex Robitussin DM (plain only, alcohol free) Saline nasal spray/drops Sudafed (pseudoephedrine) & Actifed ** use only after [redacted] weeks gestation and if you do not have high blood pressure Tylenol Vicks Vaporub Zinc lozenges Zyrtec   Constipation: Colace Ducolax suppositories Fleet enema Glycerin suppositories Metamucil Milk of magnesia Miralax Senokot Smooth move tea  Diarrhea: Kaopectate Imodium A-D  *NO pepto Bismol  Hemorrhoids: Anusol Anusol HC Preparation H Tucks  Indigestion: Tums Maalox Mylanta Zantac  Pepcid  Insomnia: Benadryl (alcohol free) 25mg  every 6 hours as needed Tylenol PM Unisom, no Gelcaps  Leg Cramps: Tums MagGel  Nausea/Vomiting:  Bonine Dramamine Emetrol Ginger extract Sea bands Meclizine  Nausea medication to take during pregnancy:  Unisom (doxylamine succinate 25 mg tablets) Take one tablet daily at bedtime. If symptoms are not adequately controlled, the dose can be increased to a maximum recommended dose of two tablets daily (1/2 tablet in the morning, 1/2 tablet mid-afternoon and one at bedtime). Vitamin B6 100mg  tablets. Take one tablet twice a day (up to 200 mg per day).  Skin Rashes: Aveeno products Benadryl cream or 25mg  every 6 hours as needed Calamine Lotion 1% cortisone cream  Yeast infection: Gyne-lotrimin  7 Monistat 7   **If taking multiple medications, please check labels to avoid duplicating the same active ingredients **take medication as directed on the label ** Do not exceed 4000 mg of tylenol in 24 hours **Do not take medications that contain aspirin or ibuprofen

## 2020-05-31 ENCOUNTER — Other Ambulatory Visit: Payer: Self-pay

## 2020-05-31 ENCOUNTER — Inpatient Hospital Stay (HOSPITAL_COMMUNITY)
Admission: AD | Admit: 2020-05-31 | Discharge: 2020-05-31 | Disposition: A | Discharge status: Home / Self Care | Attending: Obstetrics & Gynecology | Admitting: Obstetrics & Gynecology

## 2020-05-31 ENCOUNTER — Encounter (HOSPITAL_COMMUNITY): Payer: Self-pay | Admitting: Obstetrics & Gynecology

## 2020-05-31 DIAGNOSIS — O132 Gestational [pregnancy-induced] hypertension without significant proteinuria, second trimester: Secondary | ICD-10-CM | POA: Insufficient documentation

## 2020-05-31 DIAGNOSIS — R102 Pelvic and perineal pain: Secondary | ICD-10-CM | POA: Diagnosis not present

## 2020-05-31 DIAGNOSIS — Z20822 Contact with and (suspected) exposure to covid-19: Secondary | ICD-10-CM | POA: Insufficient documentation

## 2020-05-31 DIAGNOSIS — O26892 Other specified pregnancy related conditions, second trimester: Secondary | ICD-10-CM | POA: Insufficient documentation

## 2020-05-31 DIAGNOSIS — O99612 Diseases of the digestive system complicating pregnancy, second trimester: Secondary | ICD-10-CM

## 2020-05-31 DIAGNOSIS — Z8759 Personal history of other complications of pregnancy, childbirth and the puerperium: Secondary | ICD-10-CM | POA: Insufficient documentation

## 2020-05-31 DIAGNOSIS — Z3A19 19 weeks gestation of pregnancy: Secondary | ICD-10-CM | POA: Insufficient documentation

## 2020-05-31 DIAGNOSIS — R519 Headache, unspecified: Secondary | ICD-10-CM | POA: Diagnosis not present

## 2020-05-31 DIAGNOSIS — K529 Noninfective gastroenteritis and colitis, unspecified: Secondary | ICD-10-CM | POA: Diagnosis not present

## 2020-05-31 DIAGNOSIS — Z79899 Other long term (current) drug therapy: Secondary | ICD-10-CM | POA: Insufficient documentation

## 2020-05-31 DIAGNOSIS — Z7982 Long term (current) use of aspirin: Secondary | ICD-10-CM | POA: Diagnosis not present

## 2020-05-31 DIAGNOSIS — I10 Essential (primary) hypertension: Secondary | ICD-10-CM

## 2020-05-31 HISTORY — DX: Headache, unspecified: R51.9

## 2020-05-31 LAB — URINALYSIS, MICROSCOPIC (REFLEX)

## 2020-05-31 LAB — URINALYSIS, ROUTINE W REFLEX MICROSCOPIC
Bilirubin Urine: NEGATIVE
Glucose, UA: NEGATIVE mg/dL
Hgb urine dipstick: NEGATIVE
Ketones, ur: NEGATIVE mg/dL
Nitrite: NEGATIVE
Protein, ur: 100 mg/dL — AB
Specific Gravity, Urine: >1.03 — ABNORMAL HIGH (ref 1.005–1.030)
pH: 6 (ref 5.0–8.0)

## 2020-05-31 LAB — SARS CORONAVIRUS 2 BY RT PCR (HOSPITAL ORDER, PERFORMED IN ~~LOC~~ HOSPITAL LAB): SARS Coronavirus 2: NEGATIVE

## 2020-05-31 MED ORDER — LOPERAMIDE HCL 2 MG PO CAPS
4.0000 mg | ORAL_CAPSULE | Freq: Once | ORAL | Status: AC
  Administered 2020-05-31: 4 mg via ORAL
  Filled 2020-05-31: qty 2

## 2020-05-31 MED ORDER — LOPERAMIDE HCL 2 MG PO CAPS: 2.0000 mg | ORAL_CAPSULE | Freq: Four times a day (QID) | ORAL | 0 refills | Status: DC | PRN

## 2020-05-31 MED ORDER — ONDANSETRON 4 MG PO TBDP
8.0000 mg | ORAL_TABLET | Freq: Once | ORAL | Status: AC
  Administered 2020-05-31: 8 mg via ORAL
  Filled 2020-05-31: qty 2

## 2020-05-31 MED ORDER — ONDANSETRON 8 MG PO TBDP: 8.0000 mg | ORAL_TABLET | Freq: Three times a day (TID) | ORAL | 0 refills | Status: DC | PRN

## 2020-05-31 NOTE — Discharge Instructions (Signed)
Food Choices to Help Relieve Diarrhea, Adult When you have diarrhea, the foods you eat and your eating habits are very important. Choosing the right foods and drinks can help:  Relieve diarrhea.  Replace lost fluids and nutrients.  Prevent dehydration. What general guidelines should I follow?  Relieving diarrhea  Choose foods with less than 2 g or .07 oz. of fiber per serving.  Limit fats to less than 8 tsp (38 g or 1.34 oz.) a day.  Avoid the following: ? Foods and beverages sweetened with high-fructose corn syrup, honey, or sugar alcohols such as xylitol, sorbitol, and mannitol. ? Foods that contain a lot of fat or sugar. ? Fried, greasy, or spicy foods. ? High-fiber grains, breads, and cereals. ? Raw fruits and vegetables.  Eat foods that are rich in probiotics. These foods include dairy products such as yogurt and fermented milk products. They help increase healthy bacteria in the stomach and intestines (gastrointestinal tract, or GI tract).  If you have lactose intolerance, avoid dairy products. These may make your diarrhea worse.  Take medicine to help stop diarrhea (antidiarrheal medicine) only as told by your health care provider. Replacing nutrients  Eat small meals or snacks every 3-4 hours.  Eat bland foods, such as white rice, toast, or baked potato, until your diarrhea starts to get better. Gradually reintroduce nutrient-rich foods as tolerated or as told by your health care provider. This includes: ? Well-cooked protein foods. ? Peeled, seeded, and soft-cooked fruits and vegetables. ? Low-fat dairy products.  Take vitamin and mineral supplements as told by your health care provider. Preventing dehydration  Start by sipping water or a special solution to prevent dehydration (oral rehydration solution, ORS). Urine that is clear or pale yellow means that you are getting enough fluid.  Try to drink at least 8-10 cups of fluid each day to help replace lost  fluids.  You may add other liquids in addition to water, such as clear juice or decaffeinated sports drinks, as tolerated or as told by your health care provider.  Avoid drinks with caffeine, such as coffee, tea, or soft drinks.  Avoid alcohol. What foods are recommended?     The items listed may not be a complete list. Talk with your health care provider about what dietary choices are best for you. Grains White rice. White, French, or pita breads (fresh or toasted), including plain rolls, buns, or bagels. White pasta. Saltine, soda, or graham crackers. Pretzels. Low-fiber cereal. Cooked cereals made with water (such as cornmeal, farina, or cream cereals). Plain muffins. Matzo. Melba toast. Zwieback. Vegetables Potatoes (without the skin). Most well-cooked and canned vegetables without skins or seeds. Tender lettuce. Fruits Apple sauce. Fruits canned in juice. Cooked apricots, cherries, grapefruit, peaches, pears, or plums. Fresh bananas and cantaloupe. Meats and other protein foods Baked or boiled chicken. Eggs. Tofu. Fish. Seafood. Smooth nut butters. Ground or well-cooked tender beef, ham, veal, lamb, pork, or poultry. Dairy Plain yogurt, kefir, and unsweetened liquid yogurt. Lactose-free milk, buttermilk, skim milk, or soy milk. Low-fat or nonfat hard cheese. Beverages Water. Low-calorie sports drinks. Fruit juices without pulp. Strained tomato and vegetable juices. Decaffeinated teas. Sugar-free beverages not sweetened with sugar alcohols. Oral rehydration solutions, if approved by your health care provider. Seasoning and other foods Bouillon, broth, or soups made from recommended foods. What foods are not recommended? The items listed may not be a complete list. Talk with your health care provider about what dietary choices are best for you. Grains Whole   grain, whole wheat, bran, or rye breads, rolls, pastas, and crackers. Wild or brown rice. Whole grain or bran cereals. Barley.  Oats and oatmeal. Corn tortillas or taco shells. Granola. Popcorn. Vegetables Raw vegetables. Fried vegetables. Cabbage, broccoli, Brussels sprouts, artichokes, baked beans, beet greens, corn, kale, legumes, peas, sweet potatoes, and yams. Potato skins. Cooked spinach and cabbage. Fruits Dried fruit, including raisins and dates. Raw fruits. Stewed or dried prunes. Canned fruits with syrup. Meat and other protein foods Fried or fatty meats. Deli meats. Chunky nut butters. Nuts and seeds. Beans and lentils. Bacon. Hot dogs. Sausage. Dairy High-fat cheeses. Whole milk, chocolate milk, and beverages made with milk, such as milk shakes. Half-and-half. Cream. sour cream. Ice cream. Beverages Caffeinated beverages (such as coffee, tea, soda, or energy drinks). Alcoholic beverages. Fruit juices with pulp. Prune juice. Soft drinks sweetened with high-fructose corn syrup or sugar alcohols. High-calorie sports drinks. Fats and oils Butter. Cream sauces. Margarine. Salad oils. Plain salad dressings. Olives. Avocados. Mayonnaise. Sweets and desserts Sweet rolls, doughnuts, and sweet breads. Sugar-free desserts sweetened with sugar alcohols such as xylitol and sorbitol. Seasoning and other foods Honey. Hot sauce. Chili powder. Gravy. Cream-based or milk-based soups. Pancakes and waffles. Summary  When you have diarrhea, the foods you eat and your eating habits are very important.  Make sure you get at least 8-10 cups of fluid each day, or enough to keep your urine clear or pale yellow.  Eat bland foods and gradually reintroduce healthy, nutrient-rich foods as tolerated, or as told by your health care provider.  Avoid high-fiber, fried, greasy, or spicy foods. This information is not intended to replace advice given to you by your health care provider. Make sure you discuss any questions you have with your health care provider. Document Revised: 12/25/2018 Document Reviewed: 08/31/2016 Elsevier Patient  Education  2020 [employer] Inc.  

## 2020-05-31 NOTE — MAU Provider Note (Addendum)
Patient Isabel Cole is a 34 y.o. [phone]  at [redacted]w[redacted]d here with complaints of vomiting, diarrhea and cramping that started this morning at 6 am. She is followed by [facility]; she is a high risk patient with a history of a 24 week delivery and 28 week delivery. She has a history of two C/sections; first c/section was classical and she "does not know" what the second c/section was. She is a Film/video editor on medicine.   She has never had spontaneous preterm labor; her children were both born early because of medical indications.  Ob history:  2009: miscarriage 2009: C/section at 28 weeks (patient had preeclampsia).  2016: C/section at 24 weeks repeat  2021: currently pregnant    She reports some "spots of blood", but she thinks its diarrhea and hemorroids. She denies any LOF, any vaginal bleeding.  History     CSN: [id number]  Arrival date and time: 05/31/20 1012   First Provider Initiated Contact with Patient 05/31/20 1217      Chief Complaint  Patient presents with   Emesis   Nausea   Cramping   Diarrhea   Abdominal Pain This is a new problem. Episode frequency: 3-4 times an hour for "seconds to a minute" The problem has been gradually improving. The pain is located in the suprapubic region. The pain is at a severity of 10/10. The quality of the pain is cramping. Pain radiation: radiates up her belly. Associated symptoms include diarrhea, nausea and vomiting.  Diarrhea  This is a new problem. The problem occurs more than 10 times per day. The stool consistency is described as watery. Associated symptoms include abdominal pain and vomiting.  Emesis  This is a new problem. The current episode started today. The problem occurs 2 to 4 times per day. The problem has been gradually improving. There has been no fever. Associated symptoms include abdominal pain and diarrhea.   She also reports a slight headache.   She ate out at [facility] at 6:30-7. Her two boys ate with her but they  do not have any symptoms.  OB History    Gravida  4   Para  2   Term  0   Preterm  2   AB  1   Living  1     SAB  1   TAB      Ectopic      Multiple      Live Births  2           Past Medical History:  Diagnosis Date   Gestational hypertension    Headache    Hypertension     Past Surgical History:  Procedure Laterality Date   CESAREAN SECTION     IR RADIOLOGIST EVAL & MGMT  10/20/2019   LIPOSUCTION      Family History  Problem Relation Age of Onset   Hypertension Mother     Social History   Tobacco Use   Smoking status: Never Smoker   Smokeless tobacco: Never Used  Substance Use Topics   Alcohol use: No   Drug use: No    Allergies: No Known Allergies  Medications Prior to Admission  Medication Sig Dispense Refill Last Dose   aspirin 81 MG chewable tablet Chew 81 mg by mouth daily.   05/31/2020 at Unknown time   labetalol (NORMODYNE) 100 MG tablet Take 1 tablet (100 mg total) by mouth 2 (two) times daily. 60 tablet 6 05/31/2020 at Unknown time   metoCLOPramide (  REGLAN) 10 MG tablet Take 10 mg by mouth 3 (three) times daily before meals.   05/31/2020 at Unknown time   Prenatal Vit-Fe Fumarate-FA (MULTIVITAMIN-PRENATAL) 27-0.8 MG TABS tablet Take 1 tablet by mouth daily at 12 noon.   05/30/2020 at Unknown time   cholecalciferol (VITAMIN D3) 25 MCG (1000 UNIT) tablet Take 1,000 Units by mouth daily.      Multiple Vitamin (MULTIVITAMIN) tablet Take 1 tablet by mouth daily.       Review of Systems  Constitutional: Negative.   HENT: Negative.   Gastrointestinal: Positive for abdominal pain, diarrhea, nausea and vomiting.  Genitourinary: Negative.   Musculoskeletal: Negative.    Physical Exam   Blood pressure (!) 142/94, pulse (!) 104, temperature 98.4 F (36.9 C), temperature source Oral, resp. rate 16, height 5\' 1"  (1.549 m), weight 80.1 kg, last menstrual period 01/18/2020, SpO2 100 %.  Physical Exam Constitutional:       Appearance: Normal appearance.  HENT:     Head: Normocephalic.  Musculoskeletal:     Cervical back: Normal range of motion.  Neurological:     Mental Status: She is alert.     MAU Course  Procedures  MDM -patient had Zofran and imodium in MAU, feels better. Reports that her contractions have gone away and that her nausea and vomiting is better.   -SARS-2 is negative.  -UA negative for signs of dehydration, no other labs drawn.   -FHR is 155 by Doppler  Patient Vitals for the past 24 hrs:  BP Temp Temp src Pulse Resp SpO2 Height Weight  05/31/20 1525 -- -- -- (!) 104 16 100 % -- --  05/31/20 1037 (!) 142/94 98.4 F (36.9 C) Oral (!) 113 20 100 % 5\' 1"  (1.549 m) 80.1 kg    Assessment and Plan   1. Gastroenteritis   -Patient discharged home with Rx for Zofran and Imodium.  -Patient to reschedule OB appt at [facility] -Patient had mildly elevated pressures upon ; patient is [facility] and had not taken her meds today. She agrees to take her medication when she gets home, as her BP is usually elevated if she misses a dose. She plans to follow up this week with her MFM at [facility] -Patient to return to MAU if cramping, LOF, vaginal bleeding occur. Gut rest and BRAT diet reviewed. All questions answered.    [facility] Cederick Broadnax 05/31/2020, 3:30 PM

## 2020-05-31 NOTE — MAU Note (Signed)
Presents via EMS with c/o N/V, cramping, and diarrhea.  States symptoms began @ 0400 this morning.  Reports has vomited 3x, unable to count number of diarrhea stools.  Reports VB w/ wiping unsure if it's vaginal versus rectal, none blood seen in toilet.

## 2020-08-22 DIAGNOSIS — Z9851 Tubal ligation status: Secondary | ICD-10-CM | POA: Insufficient documentation

## 2020-08-22 DIAGNOSIS — I10 Essential (primary) hypertension: Secondary | ICD-10-CM | POA: Insufficient documentation

## 2020-11-09 ENCOUNTER — Other Ambulatory Visit: Payer: Self-pay

## 2020-11-09 ENCOUNTER — Ambulatory Visit
Admission: EM | Admit: 2020-11-09 | Discharge: 2020-11-09 | Disposition: A | Discharge status: Home / Self Care | Attending: Emergency Medicine | Admitting: Emergency Medicine

## 2020-11-09 DIAGNOSIS — M545 Low back pain, unspecified: Secondary | ICD-10-CM | POA: Diagnosis not present

## 2020-11-09 MED ORDER — IBUPROFEN 800 MG PO TABS: 800.0000 mg | ORAL_TABLET | Freq: Three times a day (TID) | ORAL | 0 refills | Status: AC

## 2020-11-09 MED ORDER — CYCLOBENZAPRINE HCL 5 MG PO TABS: 5.0000 mg | ORAL_TABLET | Freq: Two times a day (BID) | ORAL | 0 refills | Status: DC | PRN

## 2020-11-09 NOTE — Discharge Instructions (Signed)
Use anti-inflammatories for pain/swelling. You may take up to 800 mg Ibuprofen every 8 hours with food. You may supplement Ibuprofen with Tylenol [phone] mg every 8 hours.   You may use flexeril as needed to help with pain. This is a muscle relaxer and causes sedation- please use only at bedtime or when you will be home and not have to drive/work  Alternate ice and heat to back  Gentle stretching, avoid complete bedrest Follow-up if not improving or worsening

## 2020-11-09 NOTE — ED Provider Notes (Signed)
[facility]    CSN: [id number] Arrival date & time: 11/09/20  1154      History   Chief Complaint Chief Complaint  Patient presents with  . Fall    HPI Isabel Cole is a 35 y.o. female approximately 4 months postpartum, breast-feeding presenting today for evaluation of back pain after fall.  Slipped on hardwood steps earlier this morning, denies hitting head or loss of consciousness.  Pain is mainly been to her right lower back.  Denies any issues controlling urination or bowel movements since.  Denies radiation into the lower legs.  Denies numbness or tingling.  HPI  Past Medical History:  Diagnosis Date  . Gestational hypertension   . Headache   . Hypertension     Patient Active Problem List   Diagnosis Date Noted  . Chronic hypertension 05/31/2020  . Abdominal wall abscess 10/08/2019  . Acute blood loss anemia 10/08/2019  . Sepsis (HCC) 10/08/2019  . Hypokalemia 10/08/2019  . Postoperative anemia 10/07/2019  . Abnormal liver function 10/07/2019    Past Surgical History:  Procedure Laterality Date  . CESAREAN SECTION    . IR RADIOLOGIST EVAL & MGMT  10/20/2019  . LIPOSUCTION      OB History    Gravida  4   Para  2   Term  0   Preterm  2   AB  1   Living  1     SAB  1   IAB      Ectopic      Multiple      Live Births  2            Home Medications    Prior to Admission medications   Medication Sig Start Date End Date Taking? Authorizing Provider  cyclobenzaprine (FLEXERIL) 5 MG tablet Take 1-2 tablets (5-10 mg total) by mouth 2 (two) times daily as needed for muscle spasms. 11/09/20  Yes Kahla Risdon C, PA-C  ibuprofen (ADVIL) 800 MG tablet Take 1 tablet (800 mg total) by mouth 3 (three) times daily. 11/09/20  Yes Merion Grimaldo C, PA-C  aspirin 81 MG chewable tablet Chew 81 mg by mouth daily.    [provider]  cholecalciferol (VITAMIN D3) 25 MCG (1000 UNIT) tablet Take 1,000 Units by mouth daily.     [provider]  labetalol (NORMODYNE) 100 MG tablet Take 1 tablet (100 mg total) by mouth 2 (two) times daily. 02/09/20   Raelyn Mora, CNM  Prenatal Vit-Fe Fumarate-FA (MULTIVITAMIN-PRENATAL) 27-0.8 MG TABS tablet Take 1 tablet by mouth daily at 12 noon.    [provider]    Family History Family History  Problem Relation Age of Onset  . Hypertension Mother     Social History Social History   Tobacco Use  . Smoking status: Never Smoker  . Smokeless tobacco: Never Used  Substance Use Topics  . Alcohol use: No  . Drug use: No     Allergies   Patient has no known allergies.   Review of Systems Review of Systems  Constitutional: Negative for fatigue and fever.  Eyes: Negative for visual disturbance.  Respiratory: Negative for shortness of breath.   Cardiovascular: Negative for chest pain.  Gastrointestinal: Negative for abdominal pain, nausea and vomiting.  Musculoskeletal: Positive for arthralgias, back pain and myalgias. Negative for joint swelling.  Skin: Negative for color change, rash and wound.  Neurological: Negative for dizziness, weakness, light-headedness and headaches.     Physical Exam Triage Vital Signs  ED Triage Vitals [11/09/20 1226]  Enc Vitals Group     BP (!) 141/83     Pulse Rate 100     Resp 18     Temp 99 F (37.2 C)     Temp Source Oral     SpO2 97 %     Weight      Height      Head Circumference      Peak Flow      Pain Score 7     Pain Loc      Pain Edu?      Excl. in GC?    No data found.  Updated Vital Signs BP (!) 141/83 (BP Location: Left Arm)   Pulse 100   Temp 99 F (37.2 C) (Oral)   Resp 18   LMP 01/18/2020 (Exact Date)   SpO2 97%   Breastfeeding Yes   Visual Acuity Right Eye Distance:   Left Eye Distance:   Bilateral Distance:    Right Eye Near:   Left Eye Near:    Bilateral Near:     Physical Exam Vitals and nursing note reviewed.  Constitutional:      Appearance: She is  well-developed and well-nourished.     Comments: No acute distress  HENT:     Head: Normocephalic and atraumatic.     Nose: Nose normal.  Eyes:     Conjunctiva/sclera: Conjunctivae normal.  Cardiovascular:     Rate and Rhythm: Normal rate.  Pulmonary:     Effort: Pulmonary effort is normal. No respiratory distress.  Abdominal:     General: There is no distension.  Musculoskeletal:        General: Normal range of motion.     Cervical back: Neck supple.     Comments: Minimal tenderness to palpation of spine midline, no palpable deformity or step-off, increased tenderness to palpation throughout right lumbar musculature diffusely  Strength at hips and knees 5/5 and equal bilaterally, patellar reflex 2+ bilateral  Skin:    General: Skin is warm and dry.  Neurological:     Mental Status: She is alert and oriented to person, place, and time.  Psychiatric:        Mood and Affect: Mood and affect normal.      UC Treatments / Results  Labs (all labs ordered are listed, but only abnormal results are displayed) Labs Reviewed - No data to display  EKG   Radiology No results found.  Procedures Procedures (including critical care time)  Medications Ordered in UC Medications - No data to display  Initial Impression / Assessment and Plan / UC Course  I have reviewed the triage vital signs and the nursing notes.  Pertinent labs & imaging results that were available during my care of the patient were reviewed by me and considered in my medical decision making (see chart for details).     Right lower back pain, pain largely more lateral lower lumbar musculature rather than midline, discussed options of imaging with patient, through shared decision vision making opted to defer and treat with anti-inflammatories and muscle relaxers.  Tylenol and ibuprofen, Flexeril.  Discussed activity modification.  Discussed strict return precautions. Patient verbalized understanding and is  agreeable with plan.  Final Clinical Impressions(s) / UC Diagnoses   Final diagnoses:  Acute right-sided low back pain without sciatica     Discharge Instructions     Use anti-inflammatories for pain/swelling. You may take up to 800 mg Ibuprofen every 8 hours  with food. You may supplement Ibuprofen with Tylenol [phone] mg every 8 hours.   You may use flexeril as needed to help with pain. This is a muscle relaxer and causes sedation- please use only at bedtime or when you will be home and not have to drive/work  Alternate ice and heat to back  Gentle stretching, avoid complete bedrest Follow-up if not improving or worsening    ED Prescriptions    Medication Sig Dispense Auth. Provider   ibuprofen (ADVIL) 800 MG tablet Take 1 tablet (800 mg total) by mouth 3 (three) times daily. 21 tablet Madaline Lefeber C, PA-C   cyclobenzaprine (FLEXERIL) 5 MG tablet Take 1-2 tablets (5-10 mg total) by mouth 2 (two) times daily as needed for muscle spasms. 24 tablet [place] Silveria, [place] C, PA-C     PDMP not reviewed this encounter.   Lew Dawes, PA-C 11/09/20 1310

## 2020-11-09 NOTE — ED Triage Notes (Signed)
Pt states fell down 8 wood steps in her house this morning. C/o lower back pain. Denies hitting her head or LOC.

## 2021-06-01 IMAGING — CT CT ANGIO CHEST
2 of 5 series · 16 of 46 positions shown · IV contrast (omnipaque)
Comparison: None.

CLINICAL DATA: Abdominal distension.  Shortness of breath

EXAM:
CT ANGIOGRAPHY CHEST
CT ABDOMEN AND PELVIS WITH CONTRAST
TECHNIQUE: Multidetector CT imaging of the chest was performed using the
standard protocol during bolus administration of intravenous
contrast. Multiplanar CT image reconstructions and MIPs were
obtained to evaluate the vascular anatomy. Multidetector CT imaging
of the abdomen and pelvis was performed using the standard protocol
during bolus administration of intravenous contrast.
CONTRAST:  100mL OMNIPAQUE IOHEXOL 350 MG/ML SOLN

[Series 3: pe 3.0 i30f 3 · axial · 0.82mm/px · z∈[+1140,+1320]mm · 14 of 70 slices shown]
[im 5/70  lung]
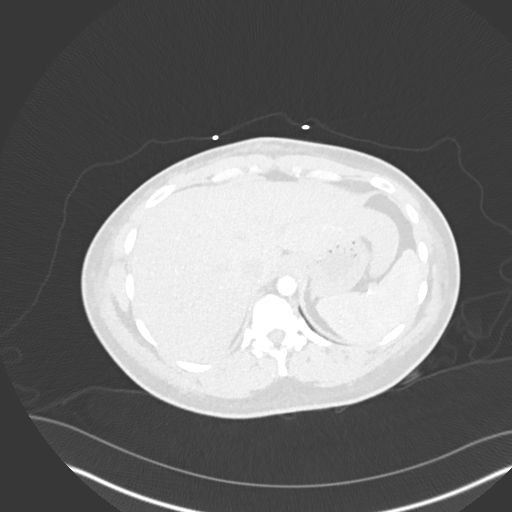
[im 9/70  soft-tissue]
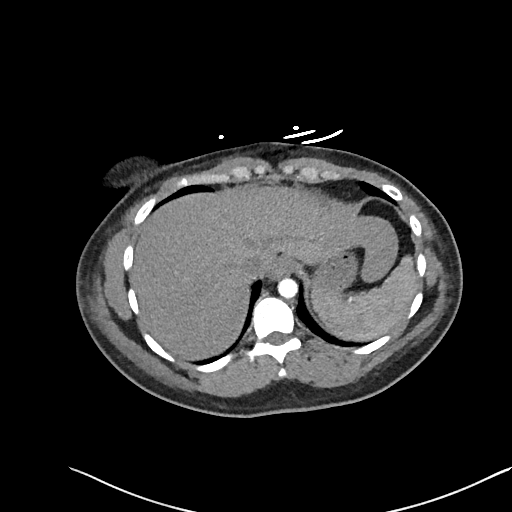
[im 14/70  lung]
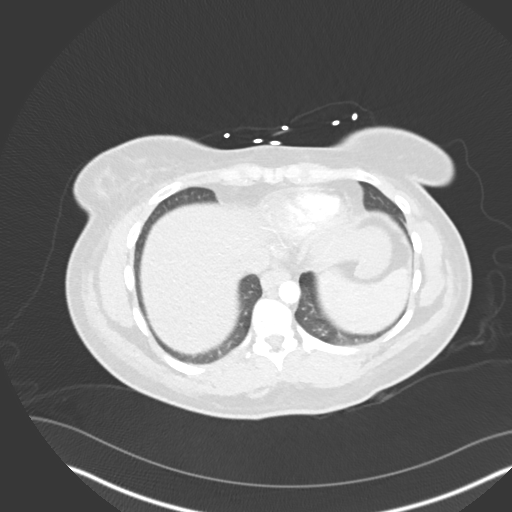
[im 18/70  soft-tissue]
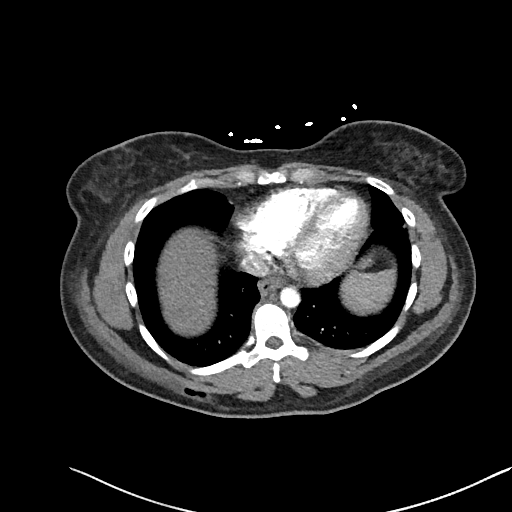
[im 23/70  lung]
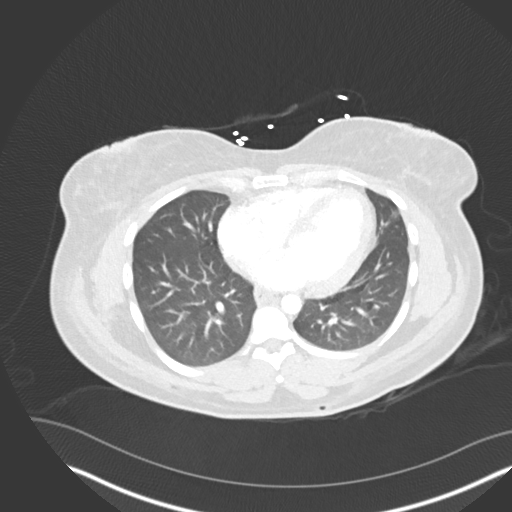
[im 27/70  soft-tissue]
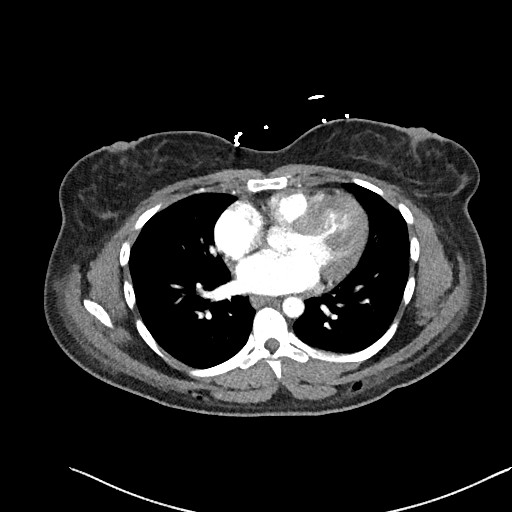
[im 32/70  lung]
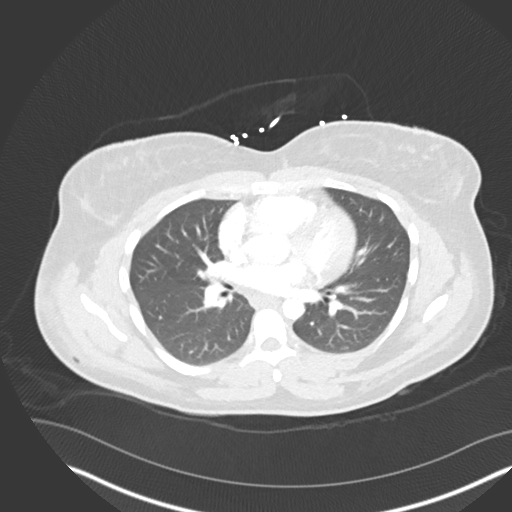
[im 38/70  soft-tissue]
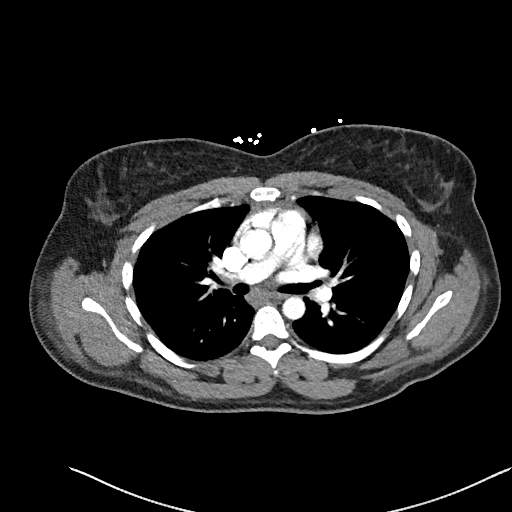
[im 43/70  lung]
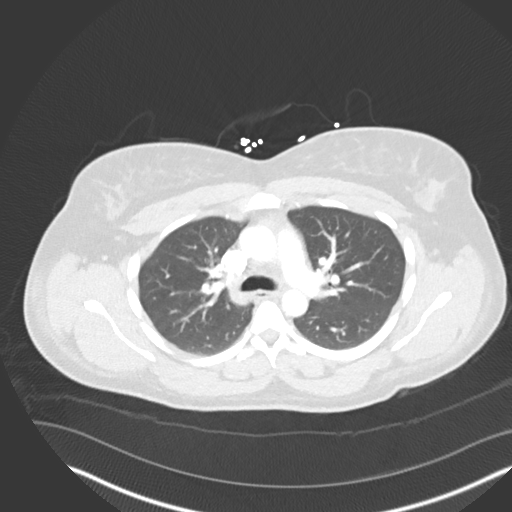
[im 47/70  soft-tissue]
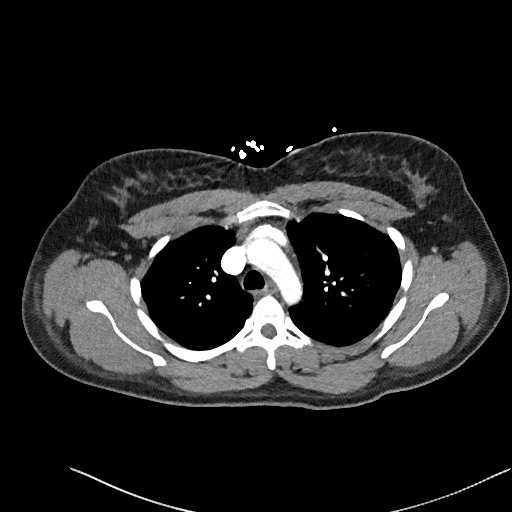
[im 52/70  lung]
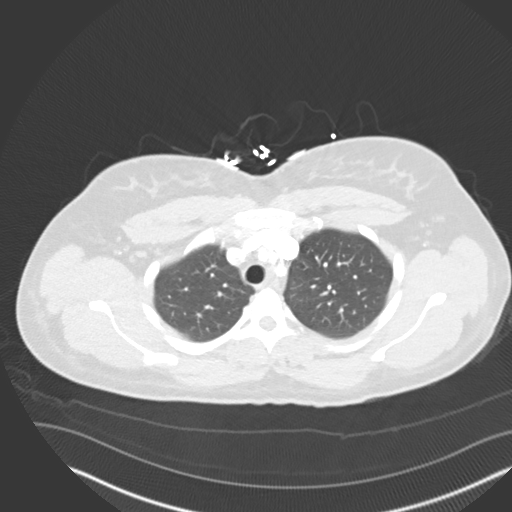
[im 56/70  soft-tissue]
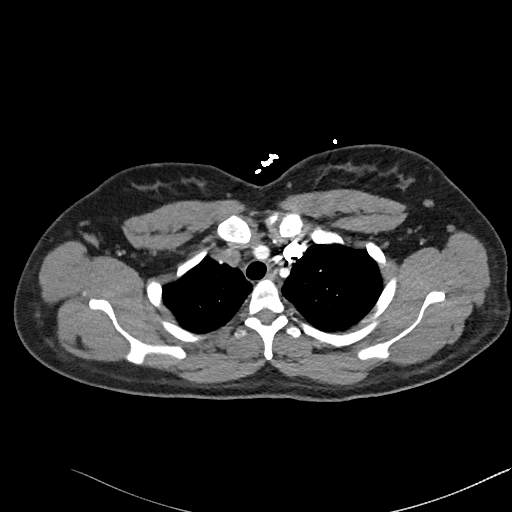
[im 61/70  lung]
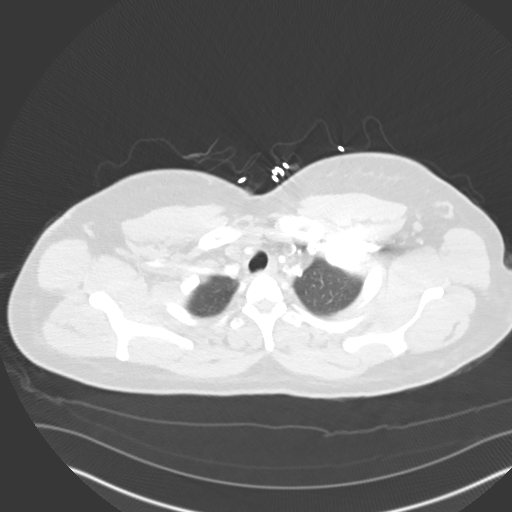
[im 65/70  soft-tissue]
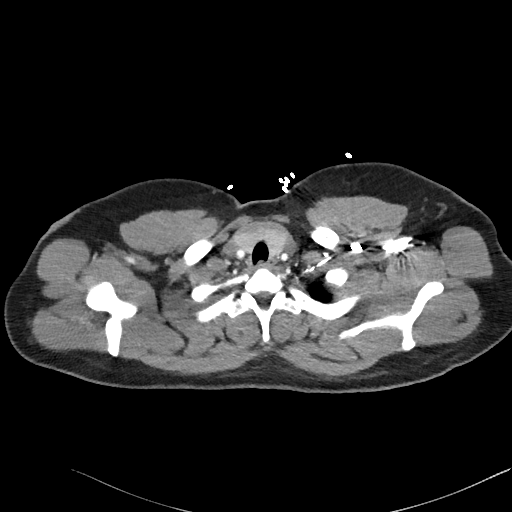

[Series 6: coronal mpr · coronal · 0.41mm/px · 2 of 77 slices shown]
[im 26/77  soft-tissue]
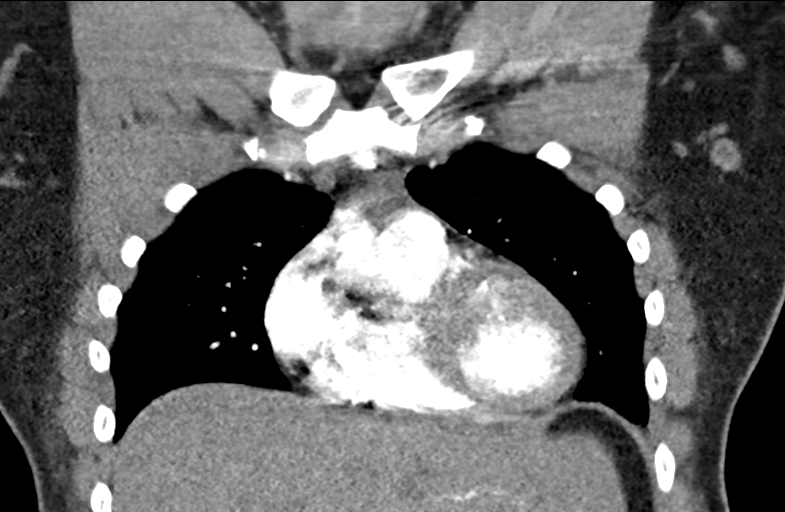
[im 51/77  soft-tissue]
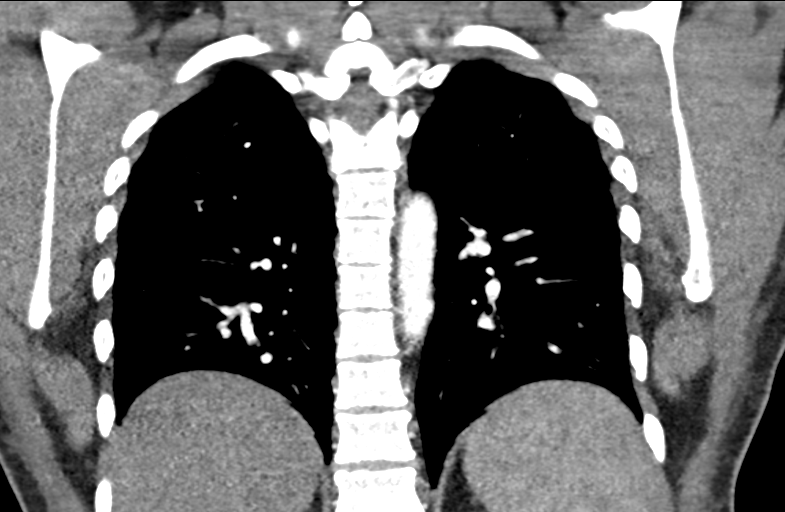

[16 of 46 positions shown; findings below may reference images not displayed]

FINDINGS: CTA CHEST FINDINGS

Cardiovascular: Contrast injection is sufficient to demonstrate
satisfactory opacification of the pulmonary arteries to the
segmental level. There is no pulmonary embolus. The main pulmonary
artery is within normal limits for size. There is no CT evidence of
acute right heart strain. The visualized aorta is normal. Heart size
is normal, without pericardial effusion.

Mediastinum/Nodes:

--No mediastinal or hilar lymphadenopathy.

--No axillary lymphadenopathy.

--No supraclavicular lymphadenopathy.

--the thyroid gland is enlarged without evidence for distinct
thyroid nodule.

--The esophagus is unremarkable

Lungs/Pleura: No pulmonary nodules or masses. No pleural effusion or
pneumothorax. No focal airspace consolidation. No focal pleural
abnormality.

Musculoskeletal: No chest wall abnormality. No acute or significant
osseous findings.

Review of the MIP images confirms the above findings.

CT ABDOMEN and PELVIS FINDINGS

Hepatobiliary: The liver is normal. Normal gallbladder.There is no
biliary ductal dilation.

Pancreas: Normal contours without ductal dilatation. No
peripancreatic fluid collection.

Spleen: No splenic laceration or hematoma.

Adrenals/Urinary Tract:

--Adrenal glands: No adrenal hemorrhage.

--Right kidney/ureter: No hydronephrosis or perinephric hematoma.

--Left kidney/ureter: No hydronephrosis or perinephric hematoma.

--Urinary bladder: Unremarkable.

Stomach/Bowel:

--Stomach/Duodenum: No hiatal hernia or other gastric abnormality.
Normal duodenal course and caliber.

--Small bowel: No dilatation or inflammation.

--Colon: No focal abnormality.

--Appendix: Normal.

Vascular/Lymphatic: Normal course and caliber of the major abdominal
vessels.

--No retroperitoneal lymphadenopathy.

--No mesenteric lymphadenopathy.

--there are mildly enlarged inguinal lymph nodes bilaterally favored
to be reactive.

Reproductive: Unremarkable

Other: There is diffuse body wall edema. There are pockets of
subcutaneous gas involving the posterior thorax favored to be
related to the patient's reported history of recent liposuction. In
the anterior abdominal wall. There are fluid collections deep to the
subcutaneous fat and superficial to the underlying muscular fascia.
There is overlying skin thickening.

Musculoskeletal. No acute displaced fractures.

Review of the MIP images confirms the above findings.
IMPRESSION: 1. No acute pulmonary embolism.
2. The lungs are clear.
3. No acute intra-abdominal process.
4. Sequela of finding involving the thorax and abdomen likely
related to the patient's reported history recent liposuction. There
are significant subcutaneous fluid collections involving the
abdominal wall which may represent postoperative seromas or
hematomas. A developing abscess is not excluded on this exam.

## 2024-01-01 ENCOUNTER — Ambulatory Visit: Admission: EM | Admit: 2024-01-01 | Discharge: 2024-01-01 | Disposition: A | Discharge status: Home / Self Care

## 2024-01-01 DIAGNOSIS — M5442 Lumbago with sciatica, left side: Secondary | ICD-10-CM

## 2024-01-01 DIAGNOSIS — E119 Type 2 diabetes mellitus without complications: Secondary | ICD-10-CM | POA: Diagnosis not present

## 2024-01-01 MED ORDER — CYCLOBENZAPRINE HCL 10 MG PO TABS: 10.0000 mg | ORAL_TABLET | Freq: Two times a day (BID) | ORAL | 0 refills | Status: AC | PRN

## 2024-01-01 MED ORDER — PREDNISONE 20 MG PO TABS: 40.0000 mg | ORAL_TABLET | Freq: Every day | ORAL | 0 refills | Status: AC

## 2024-01-01 NOTE — ED Triage Notes (Signed)
"  I don't know why I have this pain in my back, but it is a sharp shooting pain down my left lower back/pant line & with bending pain increases shooting down buttocks and leg". "This is the 9th day and it is just getting worse".

## 2024-01-01 NOTE — ED Provider Notes (Signed)
 [facility]    CSN: [id number] Arrival date & time: 01/01/24  1744      History   Chief Complaint Chief Complaint  Patient presents with   Back Pain    HPI Isabel Cole is a 38 y.o. female.   Patient here today for evaluation of lower back pain that is present to her left side that radiates down her left pant line at times.  She notes occasionally the pain will shoot into her buttocks and leg.  She states she has had symptoms for 9 days and they seem to be worsening.  Denies any injury.  The history is provided by the patient.  Back Pain Associated symptoms: no abdominal pain, no fever and no numbness     Past Medical History:  Diagnosis Date   Gestational hypertension    Headache    Hypertension     Patient Active Problem List   Diagnosis Date Noted   S/P tubal ligation 08/22/2020   Essential hypertension 08/22/2020   Postpartum care following cesarean delivery 07/15/2020   Chronic hypertension 05/31/2020   Abdominal wall abscess 10/08/2019   Acute blood loss anemia 10/08/2019   Sepsis (HCC) 10/08/2019   Hypokalemia 10/08/2019   Postoperative anemia 10/07/2019   Abnormal liver function 10/07/2019   History of classical cesarean section 11/18/2014   History of severe pre-eclampsia 11/03/2014   Anemia 11/03/2014   Chronic headache 10/17/2012    Past Surgical History:  Procedure Laterality Date   CESAREAN SECTION     IR RADIOLOGIST EVAL & MGMT  10/20/2019   LIPOSUCTION      OB History     Gravida  4   Para  2   Term  0   Preterm  2   AB  1   Living  1      SAB  1   IAB      Ectopic      Multiple      Live Births  2            Home Medications    Prior to Admission medications   Medication Sig Start Date End Date Taking? Authorizing Provider  acetaminophen (TYLENOL) 325 MG tablet Take 325 mg by mouth every 6 (six) hours as needed. 07/05/20  Yes [provider]  atenolol-chlorthalidone (TENORETIC) 50-25  MG tablet Take 1 tablet by mouth daily. 05/10/21  Yes [provider]  citalopram (CELEXA) 10 MG tablet Take 10 mg by mouth daily. 05/10/21  Yes [provider]  Clobetasol Prop Emollient Base 0.05 % emollient cream Apply 1 Application topically 2 (two) times daily. 05/10/21  Yes [provider]  cyclobenzaprine (FLEXERIL) 10 MG tablet Take 1 tablet (10 mg total) by mouth 2 (two) times daily as needed for muscle spasms. 01/01/24  Yes Tomi Bamberger, PA-C  DUPIXENT 300 MG/2ML SOAJ  05/28/22  Yes [provider]  KLOR-CON M10 10 MEQ tablet Take 10 mEq by mouth daily. 12/04/23  Yes [provider]  medroxyPROGESTERone (DEPO-PROVERA) 150 MG/ML injection Inject 150 mg into the muscle every 3 (three) months. 03/05/22  Yes [provider]  medroxyPROGESTERone (PROVERA) 10 MG tablet Take 10 mg by mouth daily. 07/11/22  Yes [provider]  metFORMIN (GLUCOPHAGE) 500 MG tablet Take 0.5 tablets by mouth 2 (two) times daily. 06/27/22  Yes [provider]  metoprolol tartrate (LOPRESSOR) 50 MG tablet Take 50 mg by mouth 2 (two) times daily. 05/31/22  Yes [provider]  MOUNJARO 12.5 MG/0.5ML Pen Inject 12.5 mg into the skin once a week. 06/27/22  Yes [provider]  MOUNJARO 15 MG/0.5ML Pen Inject 15 mg into the skin once a week. 05/02/23  Yes [provider]  NIFEdipine (PROCARDIA XL/NIFEDICAL XL) 60 MG 24 hr tablet Take 60 mg by mouth daily. 07/05/20  Yes [provider]  predniSONE (DELTASONE) 20 MG tablet Take 2 tablets (40 mg total) by mouth daily with breakfast for 5 days. 01/01/24 01/06/24 Yes Vernestine Gondola, PA-C  tirzepatide [facility]) 10 MG/0.5ML Pen Inject 10 mg into the skin once a week. 11/30/22  Yes [provider]  aspirin 81 MG chewable tablet Chew 81 mg by mouth daily.    [provider]  atenolol-chlorthalidone (TENORETIC) 100-25 MG tablet Take 1 tablet by mouth daily.   Yes  [provider]  cholecalciferol (VITAMIN D3) 25 MCG (1000 UNIT) tablet Take 1,000 Units by mouth daily.    [provider]  hydrOXYzine (VISTARIL) 25 MG capsule Take 25 mg by mouth at bedtime.    [provider]  ibuprofen (ADVIL) 800 MG tablet Take 1 tablet (800 mg total) by mouth 3 (three) times daily. 11/09/20   Wieters, Hallie C, PA-C  labetalol (NORMODYNE) 100 MG tablet Take 1 tablet (100 mg total) by mouth 2 (two) times daily. 02/09/20   Dawson, Rolitta, CNM  Multiple Vitamin (MULTIVITAMIN PO) 1 cap(s) orally once a day for 30 day(s)    [provider]  Prenatal Vit-Fe Fumarate-FA (MULTIVITAMIN-PRENATAL) 27-0.8 MG TABS tablet Take 1 tablet by mouth daily at 12 noon.    [provider]  PRENATAL VIT-FE FUMARATE-FA PO Take 1 tablet by mouth daily.    [provider]    Family History Family History  Problem Relation Age of Onset   Hypertension Mother     Social History Social History   Tobacco Use   Smoking status: Never   Smokeless tobacco: Never  Vaping Use   Vaping status: Never Used  Substance Use Topics   Alcohol use: Not Currently   Drug use: Never     Allergies   Patient has no known allergies.   Review of Systems Review of Systems  Constitutional:  Negative for chills and fever.  Eyes:  Negative for discharge and redness.  Respiratory:  Negative for shortness of breath.   Gastrointestinal:  Negative for abdominal pain, nausea and vomiting.  Musculoskeletal:  Positive for back pain and myalgias.  Neurological:  Negative for numbness.     Physical Exam Triage Vital Signs ED Triage Vitals  Encounter Vitals Group     BP 01/01/24 1802 138/89     Systolic BP Percentile --      Diastolic BP Percentile --      Pulse Rate 01/01/24 1802 93     Resp 01/01/24 1802 18     Temp 01/01/24 1802 98.4 F (36.9 C)     Temp Source 01/01/24 1802 Oral     SpO2 01/01/24 1802 98 %     Weight 01/01/24 1758 140 lb (63.5  kg)     Height 01/01/24 1758 5\' 1"  (1.549 m)     Head Circumference --      Peak Flow --      Pain Score 01/01/24 1754 10     Pain Loc --      Pain Education --      Exclude from Growth Chart --    No data found.  Updated Vital Signs  BP 138/89 (BP Location: Left Arm)   Pulse 93   Temp 98.4 F (36.9 C) (Oral)   Resp 18   Ht 5\' 1"  (1.549 m)   Wt 140 lb (63.5 kg)   LMP 12/05/2023 (Approximate)   SpO2 98%   BMI 26.45 kg/m   Visual Acuity Right Eye Distance:   Left Eye Distance:   Bilateral Distance:    Right Eye Near:   Left Eye Near:    Bilateral Near:     Physical Exam Vitals and nursing note reviewed.  Constitutional:      General: She is not in acute distress.    Appearance: Normal appearance. She is not ill-appearing.  HENT:     Head: Normocephalic and atraumatic.  Eyes:     Conjunctiva/sclera: Conjunctivae normal.  Cardiovascular:     Rate and Rhythm: Normal rate.  Pulmonary:     Effort: Pulmonary effort is normal. No respiratory distress.  Musculoskeletal:     Comments: No tenderness to palpation to midline thoracic or lumbar spine.  Mild tenderness noted to the left low back.  Neurological:     Mental Status: She is alert.  Psychiatric:        Mood and Affect: Mood normal.        Behavior: Behavior normal.        Thought Content: Thought content normal.      UC Treatments / Results  Labs (all labs ordered are listed, but only abnormal results are displayed) Labs Reviewed - No data to display  EKG   Radiology No results found.  Procedures Procedures (including critical care time)  Medications Ordered in UC Medications - No data to display  Initial Impression / Assessment and Plan / UC Course  I have reviewed the triage vital signs and the nursing notes.  Pertinent labs & imaging results that were available during my care of the patient were reviewed by me and considered in my medical decision making (see chart for details).     Suspect muscular strain with left-sided sciatica and will treat with steroid burst and muscle relaxer.  Advised muscle relaxer may cause drowsiness and use with caution.  Patient is a well-controlled type II diabetic and I advised her to monitor her glucose levels while taking steroids.  Final Clinical Impressions(s) / UC Diagnoses   Final diagnoses:  Acute left-sided low back pain with left-sided sciatica  Type 2 diabetes mellitus without complication, without long-term current use of insulin [facility])   Discharge Instructions   None    ED Prescriptions     Medication Sig Dispense Auth. Provider   predniSONE (DELTASONE) 20 MG tablet Take 2 tablets (40 mg total) by mouth daily with breakfast for 5 days. 10 tablet Jami Mcclintock F, PA-C   cyclobenzaprine (FLEXERIL) 10 MG tablet Take 1 tablet (10 mg total) by mouth 2 (two) times daily as needed for muscle spasms. 20 tablet Vernestine Gondola, PA-C      PDMP not reviewed this encounter.   Vernestine Gondola, PA-C 01/02/24 1230

## 2024-04-26 ENCOUNTER — Ambulatory Visit
Admission: RE | Admit: 2024-04-26 | Discharge: 2024-04-26 | Disposition: A | Discharge status: Home / Self Care | Source: Ambulatory Visit | Attending: Internal Medicine | Admitting: Internal Medicine

## 2024-04-26 ENCOUNTER — Other Ambulatory Visit: Payer: Self-pay

## 2024-04-26 VITALS — BP 132/95 | HR 86 | Temp 98.2°F | Resp 18 | Wt 142.9 lb

## 2024-04-26 DIAGNOSIS — R197 Diarrhea, unspecified: Secondary | ICD-10-CM | POA: Diagnosis present

## 2024-04-26 DIAGNOSIS — K529 Noninfective gastroenteritis and colitis, unspecified: Secondary | ICD-10-CM | POA: Diagnosis not present

## 2024-04-26 LAB — SARS CORONAVIRUS 2 BY RT PCR: SARS Coronavirus 2 by RT PCR: NEGATIVE

## 2024-04-26 MED ORDER — ONDANSETRON 8 MG PO TBDP
8.0000 mg | ORAL_TABLET | Freq: Once | ORAL | Status: AC
  Administered 2024-04-26: 8 mg via ORAL

## 2024-04-26 MED ORDER — ONDANSETRON 4 MG PO TBDP: 4.0000 mg | ORAL_TABLET | Freq: Three times a day (TID) | ORAL | 0 refills | Status: AC | PRN

## 2024-04-26 NOTE — ED Provider Notes (Signed)
 [facility]    CSN: [id number] Arrival date & time: 04/26/24  0900      History   Chief Complaint Chief Complaint  Patient presents with   Diarrhea    Entered by patient    HPI Isabel Cole is a 38 y.o. female.   38 year old female who presents urgent care with complaints of nausea, vomiting, diarrhea and abdominal pain.  Her symptoms started yesterday with nausea and vomiting.  Her last episode of vomiting was yesterday around 4 PM.  She has had a poor appetite associated with this.  She has had some generalized abdominal pain.  She has had 2 episodes of diarrhea today.  She reports overall just feeling very fatigued.  She did take a COVID test at home which was negative.  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  She had a fever yesterday but none today.  She has not had any cough, congestion, shortness of breath.  She does have some blood in her stool but has pre-existing hemorrhoids that she has had issues with.   Diarrhea Associated symptoms: abdominal pain (Generalized) and vomiting   Associated symptoms: no arthralgias, no chills and no fever     Past Medical History:  Diagnosis Date   Gestational hypertension    Headache    Hypertension     Patient Active Problem List   Diagnosis Date Noted   S/P tubal ligation 08/22/2020   Essential hypertension 08/22/2020   Postpartum care following cesarean delivery 07/15/2020   Chronic hypertension 05/31/2020   Abdominal wall abscess 10/08/2019   Acute blood loss anemia 10/08/2019   Sepsis (HCC) 10/08/2019   Hypokalemia 10/08/2019   Postoperative anemia 10/07/2019   Abnormal liver function 10/07/2019   History of classical cesarean section 11/18/2014   History of severe pre-eclampsia 11/03/2014   Anemia 11/03/2014   Chronic headache 10/17/2012    Past Surgical History:  Procedure Laterality Date   CESAREAN  SECTION     IR RADIOLOGIST EVAL & MGMT  10/20/2019   LIPOSUCTION      OB History     Gravida  4   Para  2   Term  0   Preterm  2   AB  1   Living  1      SAB  1   IAB      Ectopic      Multiple      Live Births  2            Home Medications    Prior to Admission medications   Medication Sig Start Date End Date Taking? Authorizing Provider  atenolol-chlorthalidone (TENORETIC) 100-25 MG tablet Take 1 tablet by mouth daily.   Yes [provider]  clobetasol cream (TEMOVATE) 0.05 % Apply 1 Application topically 2 (two) times daily. 04/22/24  Yes [provider]  DUPIXENT 300 MG/2ML SOAJ  05/28/22  Yes [provider]  hydrOXYzine  (VISTARIL ) 25 MG capsule Take 25 mg by mouth at bedtime.   Yes [provider]  KLOR-CON  M10 10 MEQ tablet Take 10 mEq by mouth daily. 12/04/23  Yes [provider]  MOUNJARO 12.5 MG/0.5ML Pen Inject 12.5 mg into the skin once a week. 06/27/22  Yes [provider]  ondansetron  (ZOFRAN -ODT) 4 MG disintegrating tablet Take 1-2 tablets (4-8 mg total) by mouth every 8 (eight) hours as needed for nausea or vomiting. 04/26/24  Yes  Hanifa Antonetti A, PA-C  acetaminophen  (TYLENOL ) 325 MG tablet Take 325 mg by mouth every 6 (six) hours as needed. 07/05/20   [provider]  aspirin 81 MG chewable tablet Chew 81 mg by mouth daily. Patient not taking: Reported on 04/26/2024    [provider]  atenolol-chlorthalidone (TENORETIC) 50-25 MG tablet Take 1 tablet by mouth daily. Patient not taking: Reported on 04/26/2024 05/10/21   [provider]  cholecalciferol (VITAMIN D3) 25 MCG (1000 UNIT) tablet Take 1,000 Units by mouth daily. Patient not taking: Reported on 04/26/2024    [provider]  citalopram (CELEXA) 10 MG tablet Take 10 mg by mouth daily. Patient not taking: Reported on 04/26/2024 05/10/21   [provider]  Clobetasol Prop Emollient Base 0.05 %  emollient cream Apply 1 Application topically 2 (two) times daily. 05/10/21   [provider]  cyclobenzaprine  (FLEXERIL ) 10 MG tablet Take 1 tablet (10 mg total) by mouth 2 (two) times daily as needed for muscle spasms. Patient not taking: Reported on 04/26/2024 01/01/24   Billy Asberry FALCON, PA-C  ibuprofen  (ADVIL ) 800 MG tablet Take 1 tablet (800 mg total) by mouth 3 (three) times daily. Patient not taking: Reported on 04/26/2024 11/09/20   Wieters, Hallie C, PA-C  labetalol  (NORMODYNE ) 100 MG tablet Take 1 tablet (100 mg total) by mouth 2 (two) times daily. Patient not taking: Reported on 04/26/2024 02/09/20   Dawson, Rolitta, CNM  medroxyPROGESTERone (DEPO-PROVERA) 150 MG/ML injection Inject 150 mg into the muscle every 3 (three) months. Patient not taking: Reported on 04/26/2024 03/05/22   [provider]  medroxyPROGESTERone (PROVERA) 10 MG tablet Take 10 mg by mouth daily. Patient not taking: Reported on 04/26/2024 07/11/22   [provider]  metFORMIN (GLUCOPHAGE) 500 MG tablet Take 0.5 tablets by mouth 2 (two) times daily. Patient not taking: Reported on 04/26/2024 06/27/22   [provider]  metoprolol  tartrate (LOPRESSOR ) 50 MG tablet Take 50 mg by mouth 2 (two) times daily. Patient not taking: Reported on 04/26/2024 05/31/22   [provider]  MOUNJARO 15 MG/0.5ML Pen Inject 15 mg into the skin once a week. Patient not taking: Reported on 04/26/2024 05/02/23   [provider]  Multiple Vitamin (MULTIVITAMIN PO) 1 cap(s) orally once a day for 30 day(s) Patient not taking: Reported on 04/26/2024    [provider]  NIFEdipine (PROCARDIA XL/NIFEDICAL XL) 60 MG 24 hr tablet Take 60 mg by mouth daily. Patient not taking: Reported on 04/26/2024 07/05/20   [provider]  Prenatal Vit-Fe Fumarate-FA (MULTIVITAMIN-PRENATAL) 27-0.8 MG TABS tablet Take 1 tablet by mouth daily at 12 noon. Patient not taking: Reported on 04/26/2024     [provider]  PRENATAL VIT-FE FUMARATE-FA PO Take 1 tablet by mouth daily. Patient not taking: Reported on 04/26/2024    [provider]  tirzepatide [facility]) 10 MG/0.5ML Pen Inject 10 mg into the skin once a week. Patient not taking: Reported on 04/26/2024 11/30/22   [provider]    Family History Family History  Problem Relation Age of Onset   Hypertension Mother     Social History Social History   Tobacco Use   Smoking status: Never   Smokeless tobacco: Never  Vaping Use   Vaping status: Never Used  Substance Use Topics   Alcohol use: Not Currently   Drug use: Never     Allergies   Patient has no known allergies.   Review of Systems Review of Systems  Constitutional:  Negative for chills and fever.  HENT:  Negative for ear pain and sore throat.   Eyes:  Negative for pain and visual disturbance.  Respiratory:  Negative for cough and shortness of breath.   Cardiovascular:  Negative for chest pain and palpitations.  Gastrointestinal:  Positive for abdominal pain (Generalized), blood in stool (Pre-existing hemorrhoids), diarrhea, nausea and vomiting.  Genitourinary:  Negative for dysuria and hematuria.  Musculoskeletal:  Negative for arthralgias and back pain.  Skin:  Negative for color change and rash.  Neurological:  Negative for seizures and syncope.  All other systems reviewed and are negative.    Physical Exam Triage Vital Signs ED Triage Vitals  Encounter Vitals Group     BP 04/26/24 0910 (!) 132/95     Girls Systolic BP Percentile --      Girls Diastolic BP Percentile --      Boys Systolic BP Percentile --      Boys Diastolic BP Percentile --      Pulse Rate 04/26/24 0910 86     Resp 04/26/24 0910 18     Temp 04/26/24 0910 98.2 F (36.8 C)     Temp Source 04/26/24 0910 Oral     SpO2 04/26/24 0910 98 %     Weight 04/26/24 0910 142 lb 14.4 oz (64.8 kg)     Height --      Head Circumference --      Peak Flow --       Pain Score 04/26/24 0928 4     Pain Loc --      Pain Education --      Exclude from Growth Chart --    No data found.  Updated Vital Signs BP (!) 132/95 (BP Location: Left Arm)   Pulse 86   Temp 98.2 F (36.8 C) (Oral)   Resp 18   Wt 142 lb 14.4 oz (64.8 kg)   LMP 04/22/2024   SpO2 98%   Breastfeeding No   BMI 27.00 kg/m   Visual Acuity Right Eye Distance:   Left Eye Distance:   Bilateral Distance:    Right Eye Near:   Left Eye Near:    Bilateral Near:     Physical Exam Vitals and nursing note reviewed.  Constitutional:      General: She is not in acute distress.    Appearance: She is well-developed.  HENT:     Head: Normocephalic and atraumatic.  Eyes:     Conjunctiva/sclera: Conjunctivae normal.  Cardiovascular:     Rate and Rhythm: Normal rate and regular rhythm.     Heart sounds: No murmur heard. Pulmonary:     Effort: Pulmonary effort is normal. No respiratory distress.     Breath sounds: Normal breath sounds.  Abdominal:     General: Bowel sounds are normal. There is no distension.     Palpations: Abdomen is soft. There is no hepatomegaly.     Tenderness: There is generalized abdominal tenderness (mild). There is no right CVA tenderness, left CVA tenderness, guarding or rebound.     Hernia: There is no hernia in the umbilical area.  Musculoskeletal:        General: No swelling.     Cervical back: Neck supple.  Skin:    General: Skin is warm and dry.     Capillary Refill: Capillary refill takes less than 2 seconds.  Neurological:     Mental Status: She is alert.  Psychiatric:        Mood  and Affect: Mood normal.      UC Treatments / Results  Labs (all labs ordered are listed, but only abnormal results are displayed) Labs Reviewed  SARS CORONAVIRUS 2 BY RT PCR    EKG   Radiology No results found.  Procedures Procedures (including critical care time)  Medications Ordered in UC Medications  ondansetron  (ZOFRAN -ODT) disintegrating  tablet 8 mg (has no administration in time range)    Initial Impression / Assessment and Plan / UC Course  I have reviewed the triage vital signs and the nursing notes.  Pertinent labs & imaging results that were available during my care of the patient were reviewed by me and considered in my medical decision making (see chart for details).     Diarrhea of presumed infectious origin - Plan: SARS Coronavirus 2 by RT PCR (hospital order, performed in [facility] hospital lab) *cepheid single result test* Anterior Nasal Swab, SARS Coronavirus 2 by RT PCR (hospital order, performed in [facility] hospital lab) *cepheid single result test* Anterior Nasal Swab  Gastroenteritis  Symptoms and physical exam findings are most consistent with a viral gastroenteritis.  Due to your employer and possible COVID exposure at work we will send off a PCR COVID test.  This result may take 24 to 48 hours to finalize.  If this is positive we will contact you.  Recommend remaining hydrated and can try Zofran  to help with nausea.  This does have a side effect of constipation which may help the diarrhea.  We will give you first dose of Zofran  while you are here. Zofran  4-8 mg orally disintegrating tablet every 8 hours as needed for nausea. First dose given at 10:00 am today. Make sure to stay hydrated by drinking plenty of water. May continue to use Imodium  A-D to help with diarrhea. Return to urgent care or PCP if symptoms worsen or fail to resolve.    Final Clinical Impressions(s) / UC Diagnoses   Final diagnoses:  Diarrhea of presumed infectious origin  Gastroenteritis     Discharge Instructions      Symptoms and physical exam findings are most consistent with a viral gastroenteritis.  Due to your employer and possible COVID exposure at work we will send off a PCR COVID test.  This result may take 24 to 48 hours to finalize.  If this is positive we will contact you.  Recommend remaining hydrated and can try  Zofran  to help with nausea.  This does have a side effect of constipation which may help the diarrhea.  We will give you first dose of Zofran  while you are here. Zofran  4-8 mg orally disintegrating tablet every 8 hours as needed for nausea. First dose given at 10:00 am today. Make sure to stay hydrated by drinking plenty of water. May continue to use Imodium  A-D to help with diarrhea. Return to urgent care or PCP if symptoms worsen or fail to resolve.       ED Prescriptions     Medication Sig Dispense Auth. Provider   ondansetron  (ZOFRAN -ODT) 4 MG disintegrating tablet Take 1-2 tablets (4-8 mg total) by mouth every 8 (eight) hours as needed for nausea or vomiting. 20 tablet Teresa Almarie LABOR, [place]      PDMP not reviewed this encounter.   Teresa Almarie LABOR, [place] 04/26/24 [phone]

## 2024-04-26 NOTE — Discharge Instructions (Addendum)
 Symptoms and physical exam findings are most consistent with a viral gastroenteritis.  Due to your employer and possible COVID exposure at work we will send off a PCR COVID test.  This result may take 24 to 48 hours to finalize.  If this is positive we will contact you.  Recommend remaining hydrated and can try Zofran  to help with nausea.  This does have a side effect of constipation which may help the diarrhea.  We will give you first dose of Zofran  while you are here. Zofran  4-8 mg orally disintegrating tablet every 8 hours as needed for nausea. First dose given at 10:00 am today. Make sure to stay hydrated by drinking plenty of water. May continue to use Imodium  A-D to help with diarrhea. Return to urgent care or PCP if symptoms worsen or fail to resolve.

## 2024-04-26 NOTE — ED Triage Notes (Signed)
 Pt reports diarrhea since early Saturday morning with vomiting and fever. States she hasn't vomited since 4pm yesterday. Fever resolved. Still having diarrhea with blood noted (reports hemorrhoids). She had neg home covid test yesterday. Here for continued diarrhea (2 episodes today)- she took imodium  yesterday and today. She needs a note for work as well.

## 2024-04-28 ENCOUNTER — Ambulatory Visit: Payer: Self-pay | Admitting: Internal Medicine

## 2024-07-08 ENCOUNTER — Other Ambulatory Visit: Payer: Self-pay | Admitting: Physician Assistant

## 2024-07-08 DIAGNOSIS — Z1231 Encounter for screening mammogram for malignant neoplasm of breast: Secondary | ICD-10-CM

## 2024-08-10 ENCOUNTER — Ambulatory Visit

## 2024-09-08 ENCOUNTER — Ambulatory Visit

## 2024-09-24 ENCOUNTER — Ambulatory Visit

## 2024-09-25 ENCOUNTER — Ambulatory Visit

## 2024-10-06 ENCOUNTER — Ambulatory Visit

## 2024-10-12 ENCOUNTER — Ambulatory Visit

## 2024-10-23 ENCOUNTER — Ambulatory Visit

## 2024-10-29 ENCOUNTER — Ambulatory Visit
